# Patient Record
Sex: Female | Born: 1950 | Race: White | Hispanic: No | Marital: Married | Smoking: Never smoker
Health system: Southern US, Community
[De-identification: ages and names within clinical notes are randomized; demographics above are authoritative.]

## PROBLEM LIST (undated history)

## (undated) DIAGNOSIS — M1990 Unspecified osteoarthritis, unspecified site: Secondary | ICD-10-CM

## (undated) DIAGNOSIS — I10 Essential (primary) hypertension: Secondary | ICD-10-CM

## (undated) DIAGNOSIS — Z803 Family history of malignant neoplasm of breast: Secondary | ICD-10-CM

## (undated) DIAGNOSIS — Z923 Personal history of irradiation: Secondary | ICD-10-CM

## (undated) DIAGNOSIS — Z800 Family history of malignant neoplasm of digestive organs: Secondary | ICD-10-CM

## (undated) DIAGNOSIS — Z8042 Family history of malignant neoplasm of prostate: Secondary | ICD-10-CM

## (undated) HISTORY — DX: Family history of malignant neoplasm of digestive organs: Z80.0

## (undated) HISTORY — DX: Family history of malignant neoplasm of prostate: Z80.42

## (undated) HISTORY — DX: Essential (primary) hypertension: I10

## (undated) HISTORY — DX: Personal history of irradiation: Z92.3

## (undated) HISTORY — DX: Family history of malignant neoplasm of breast: Z80.3

---

## 1997-10-25 ENCOUNTER — Other Ambulatory Visit: Admission: RE | Admit: 1997-10-25 | Discharge: 1997-10-25 | Payer: Self-pay | Admitting: Obstetrics and Gynecology

## 1998-12-05 ENCOUNTER — Other Ambulatory Visit: Admission: RE | Admit: 1998-12-05 | Discharge: 1998-12-05 | Payer: Self-pay | Admitting: Obstetrics and Gynecology

## 1999-03-06 ENCOUNTER — Encounter: Admission: RE | Admit: 1999-03-06 | Discharge: 1999-03-06 | Payer: Self-pay | Admitting: Obstetrics and Gynecology

## 1999-03-06 ENCOUNTER — Encounter: Payer: Self-pay | Admitting: Obstetrics and Gynecology

## 1999-12-14 ENCOUNTER — Other Ambulatory Visit: Admission: RE | Admit: 1999-12-14 | Discharge: 1999-12-14 | Payer: Self-pay | Admitting: Obstetrics and Gynecology

## 2005-05-08 ENCOUNTER — Ambulatory Visit (HOSPITAL_COMMUNITY): Admission: RE | Admit: 2005-05-08 | Discharge: 2005-05-08 | Payer: Self-pay | Admitting: Family Medicine

## 2005-08-30 ENCOUNTER — Other Ambulatory Visit: Admission: RE | Admit: 2005-08-30 | Discharge: 2005-08-30 | Payer: Self-pay | Admitting: Family Medicine

## 2006-05-28 ENCOUNTER — Ambulatory Visit (HOSPITAL_COMMUNITY): Admission: RE | Admit: 2006-05-28 | Discharge: 2006-05-28 | Payer: Self-pay | Admitting: Family Medicine

## 2006-09-03 ENCOUNTER — Other Ambulatory Visit: Admission: RE | Admit: 2006-09-03 | Discharge: 2006-09-03 | Payer: Self-pay | Admitting: Family Medicine

## 2007-06-23 ENCOUNTER — Ambulatory Visit (HOSPITAL_COMMUNITY): Admission: RE | Admit: 2007-06-23 | Discharge: 2007-06-23 | Payer: Self-pay | Admitting: Family Medicine

## 2008-06-23 ENCOUNTER — Ambulatory Visit (HOSPITAL_COMMUNITY): Admission: RE | Admit: 2008-06-23 | Discharge: 2008-06-23 | Payer: Self-pay | Admitting: Family Medicine

## 2009-02-08 ENCOUNTER — Other Ambulatory Visit: Admission: RE | Admit: 2009-02-08 | Discharge: 2009-02-08 | Payer: Self-pay | Admitting: Family Medicine

## 2009-06-28 ENCOUNTER — Ambulatory Visit (HOSPITAL_COMMUNITY): Admission: RE | Admit: 2009-06-28 | Discharge: 2009-06-28 | Payer: Self-pay | Admitting: Family Medicine

## 2010-08-16 ENCOUNTER — Other Ambulatory Visit (HOSPITAL_COMMUNITY): Payer: Self-pay | Admitting: Family Medicine

## 2010-08-16 DIAGNOSIS — Z1231 Encounter for screening mammogram for malignant neoplasm of breast: Secondary | ICD-10-CM

## 2010-08-30 ENCOUNTER — Ambulatory Visit (HOSPITAL_COMMUNITY)
Admission: RE | Admit: 2010-08-30 | Discharge: 2010-08-30 | Disposition: A | Discharge status: Home / Self Care | Payer: BC Managed Care – PPO | Source: Ambulatory Visit | Attending: Family Medicine | Admitting: Family Medicine

## 2010-08-30 DIAGNOSIS — Z1231 Encounter for screening mammogram for malignant neoplasm of breast: Secondary | ICD-10-CM | POA: Insufficient documentation

## 2011-08-26 ENCOUNTER — Other Ambulatory Visit (HOSPITAL_COMMUNITY): Payer: Self-pay | Admitting: Family Medicine

## 2011-08-26 DIAGNOSIS — Z1231 Encounter for screening mammogram for malignant neoplasm of breast: Secondary | ICD-10-CM

## 2011-09-25 ENCOUNTER — Ambulatory Visit (HOSPITAL_COMMUNITY)
Admission: RE | Admit: 2011-09-25 | Discharge: 2011-09-25 | Disposition: A | Discharge status: Home / Self Care | Payer: BC Managed Care – PPO | Source: Ambulatory Visit | Attending: Family Medicine | Admitting: Family Medicine

## 2011-09-25 DIAGNOSIS — Z1231 Encounter for screening mammogram for malignant neoplasm of breast: Secondary | ICD-10-CM

## 2012-07-01 ENCOUNTER — Other Ambulatory Visit: Payer: Self-pay | Admitting: Family Medicine

## 2012-07-01 ENCOUNTER — Other Ambulatory Visit (HOSPITAL_COMMUNITY)
Admission: RE | Admit: 2012-07-01 | Discharge: 2012-07-01 | Disposition: A | Discharge status: Home / Self Care | Payer: BC Managed Care – PPO | Source: Ambulatory Visit | Attending: Family Medicine | Admitting: Family Medicine

## 2012-07-01 DIAGNOSIS — Z124 Encounter for screening for malignant neoplasm of cervix: Secondary | ICD-10-CM | POA: Insufficient documentation

## 2012-08-19 ENCOUNTER — Other Ambulatory Visit (HOSPITAL_COMMUNITY): Payer: Self-pay | Admitting: Family Medicine

## 2012-08-19 DIAGNOSIS — Z1231 Encounter for screening mammogram for malignant neoplasm of breast: Secondary | ICD-10-CM

## 2012-09-25 ENCOUNTER — Ambulatory Visit (HOSPITAL_COMMUNITY)
Admission: RE | Admit: 2012-09-25 | Discharge: 2012-09-25 | Disposition: A | Discharge status: Home / Self Care | Payer: BC Managed Care – PPO | Source: Ambulatory Visit | Attending: Family Medicine | Admitting: Family Medicine

## 2012-09-25 DIAGNOSIS — Z1231 Encounter for screening mammogram for malignant neoplasm of breast: Secondary | ICD-10-CM | POA: Insufficient documentation

## 2012-11-05 ENCOUNTER — Other Ambulatory Visit: Payer: Self-pay | Admitting: Dermatology

## 2013-11-25 ENCOUNTER — Other Ambulatory Visit (HOSPITAL_COMMUNITY): Payer: Self-pay | Admitting: Family Medicine

## 2013-11-25 DIAGNOSIS — Z1231 Encounter for screening mammogram for malignant neoplasm of breast: Secondary | ICD-10-CM

## 2013-12-14 ENCOUNTER — Ambulatory Visit (HOSPITAL_COMMUNITY): Payer: BC Managed Care – PPO

## 2014-02-09 ENCOUNTER — Other Ambulatory Visit (HOSPITAL_COMMUNITY): Payer: Self-pay | Admitting: Family Medicine

## 2014-02-09 DIAGNOSIS — Z1231 Encounter for screening mammogram for malignant neoplasm of breast: Secondary | ICD-10-CM

## 2014-02-14 ENCOUNTER — Ambulatory Visit (HOSPITAL_COMMUNITY)
Admission: RE | Admit: 2014-02-14 | Discharge: 2014-02-14 | Disposition: A | Discharge status: Home / Self Care | Payer: 59 | Source: Ambulatory Visit | Attending: Family Medicine | Admitting: Family Medicine

## 2014-02-14 DIAGNOSIS — Z1231 Encounter for screening mammogram for malignant neoplasm of breast: Secondary | ICD-10-CM | POA: Insufficient documentation

## 2014-03-04 ENCOUNTER — Other Ambulatory Visit: Payer: Self-pay | Admitting: Dermatology

## 2015-03-10 ENCOUNTER — Other Ambulatory Visit: Payer: Self-pay

## 2015-03-10 DIAGNOSIS — Z1231 Encounter for screening mammogram for malignant neoplasm of breast: Secondary | ICD-10-CM

## 2015-03-27 ENCOUNTER — Ambulatory Visit: Payer: 59

## 2015-04-10 ENCOUNTER — Ambulatory Visit: Payer: 59

## 2015-05-04 ENCOUNTER — Ambulatory Visit
Admission: RE | Admit: 2015-05-04 | Discharge: 2015-05-04 | Disposition: A | Discharge status: Home / Self Care | Payer: 59 | Source: Ambulatory Visit

## 2015-05-04 DIAGNOSIS — Z1231 Encounter for screening mammogram for malignant neoplasm of breast: Secondary | ICD-10-CM

## 2015-09-11 ENCOUNTER — Other Ambulatory Visit (HOSPITAL_COMMUNITY)
Admission: RE | Admit: 2015-09-11 | Discharge: 2015-09-11 | Disposition: A | Discharge status: Home / Self Care | Payer: 59 | Source: Ambulatory Visit | Attending: Family Medicine | Admitting: Family Medicine

## 2015-09-11 ENCOUNTER — Other Ambulatory Visit: Payer: Self-pay | Admitting: Family Medicine

## 2015-09-11 DIAGNOSIS — Z01419 Encounter for gynecological examination (general) (routine) without abnormal findings: Secondary | ICD-10-CM | POA: Diagnosis present

## 2015-09-11 DIAGNOSIS — Z1151 Encounter for screening for human papillomavirus (HPV): Secondary | ICD-10-CM | POA: Diagnosis not present

## 2015-09-13 LAB — CYTOLOGY - PAP

## 2016-03-19 DIAGNOSIS — D225 Melanocytic nevi of trunk: Secondary | ICD-10-CM | POA: Diagnosis not present

## 2016-03-19 DIAGNOSIS — D2261 Melanocytic nevi of right upper limb, including shoulder: Secondary | ICD-10-CM | POA: Diagnosis not present

## 2016-03-19 DIAGNOSIS — L821 Other seborrheic keratosis: Secondary | ICD-10-CM | POA: Diagnosis not present

## 2016-04-03 ENCOUNTER — Other Ambulatory Visit: Payer: Self-pay | Admitting: Family Medicine

## 2016-04-03 DIAGNOSIS — Z1231 Encounter for screening mammogram for malignant neoplasm of breast: Secondary | ICD-10-CM

## 2016-05-07 ENCOUNTER — Ambulatory Visit
Admission: RE | Admit: 2016-05-07 | Discharge: 2016-05-07 | Disposition: A | Discharge status: Home / Self Care | Payer: 59 | Source: Ambulatory Visit | Attending: Family Medicine | Admitting: Family Medicine

## 2016-05-07 DIAGNOSIS — Z1231 Encounter for screening mammogram for malignant neoplasm of breast: Secondary | ICD-10-CM

## 2016-09-13 DIAGNOSIS — Z23 Encounter for immunization: Secondary | ICD-10-CM | POA: Diagnosis not present

## 2016-09-13 DIAGNOSIS — Z0000 Encounter for general adult medical examination without abnormal findings: Secondary | ICD-10-CM | POA: Diagnosis not present

## 2016-09-13 DIAGNOSIS — I10 Essential (primary) hypertension: Secondary | ICD-10-CM | POA: Diagnosis not present

## 2016-09-13 DIAGNOSIS — E785 Hyperlipidemia, unspecified: Secondary | ICD-10-CM | POA: Diagnosis not present

## 2017-03-19 DIAGNOSIS — D225 Melanocytic nevi of trunk: Secondary | ICD-10-CM | POA: Diagnosis not present

## 2017-03-19 DIAGNOSIS — D1800 Hemangioma unspecified site: Secondary | ICD-10-CM | POA: Diagnosis not present

## 2017-03-19 DIAGNOSIS — D2371 Other benign neoplasm of skin of right lower limb, including hip: Secondary | ICD-10-CM | POA: Diagnosis not present

## 2017-03-19 DIAGNOSIS — D485 Neoplasm of uncertain behavior of skin: Secondary | ICD-10-CM | POA: Diagnosis not present

## 2017-03-19 DIAGNOSIS — D2262 Melanocytic nevi of left upper limb, including shoulder: Secondary | ICD-10-CM | POA: Diagnosis not present

## 2017-05-05 ENCOUNTER — Other Ambulatory Visit: Payer: Self-pay | Admitting: Family Medicine

## 2017-05-05 DIAGNOSIS — Z1231 Encounter for screening mammogram for malignant neoplasm of breast: Secondary | ICD-10-CM

## 2017-05-07 IMAGING — MG 2D DIGITAL SCREENING BILATERAL MAMMOGRAM WITH CAD AND ADJUNCT TO
8 of 13 series · 8 of 29 positions shown · non-contrast
Comparison: Previous exam(s).

ACR Breast Density Category a: The breast tissue is almost entirely
fatty.

CLINICAL DATA: Screening.

EXAM:
2D DIGITAL SCREENING BILATERAL MAMMOGRAM WITH CAD AND ADJUNCT TOMO

[L CC (1 of 2)]
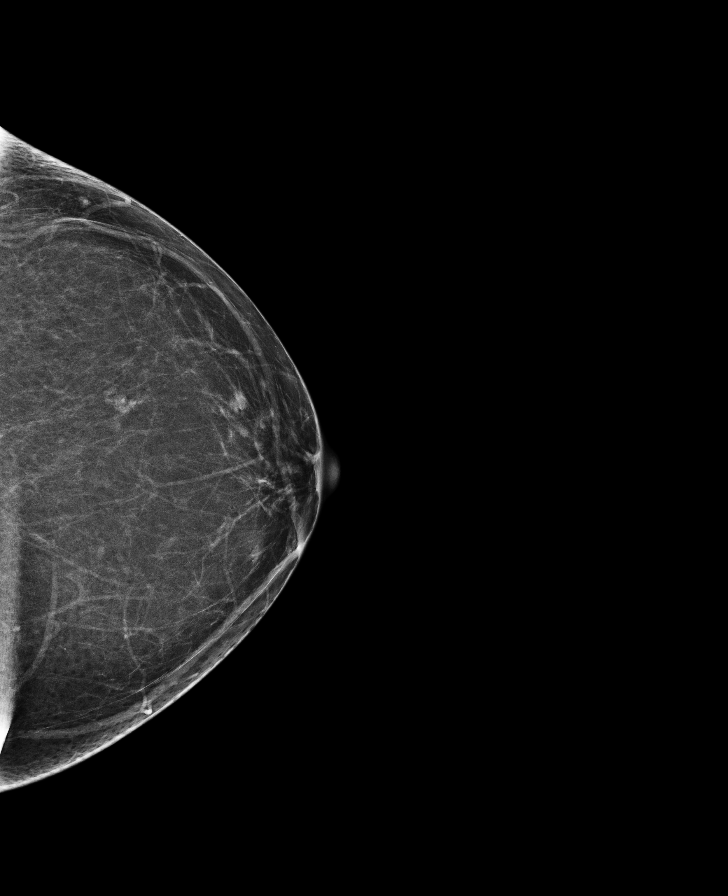

[R CC synth-2D]
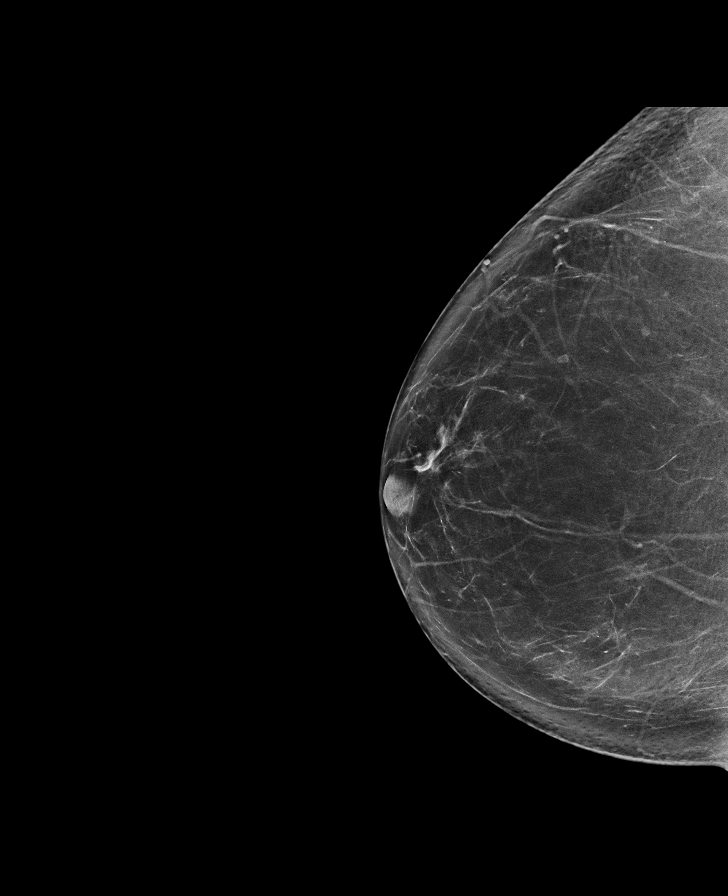

[L MLO synth-2D]
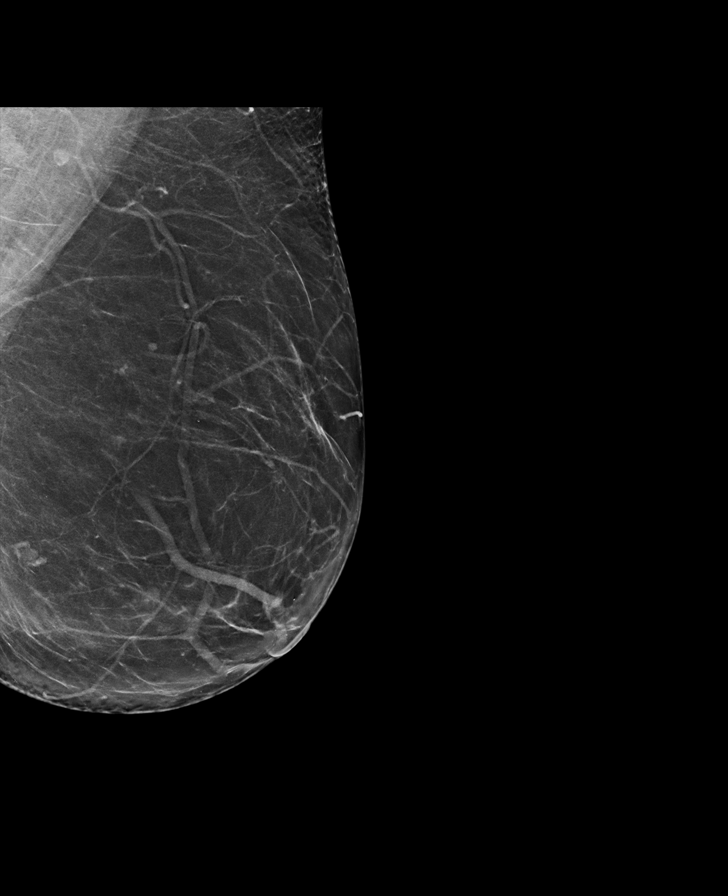

[L CC synth-2D]
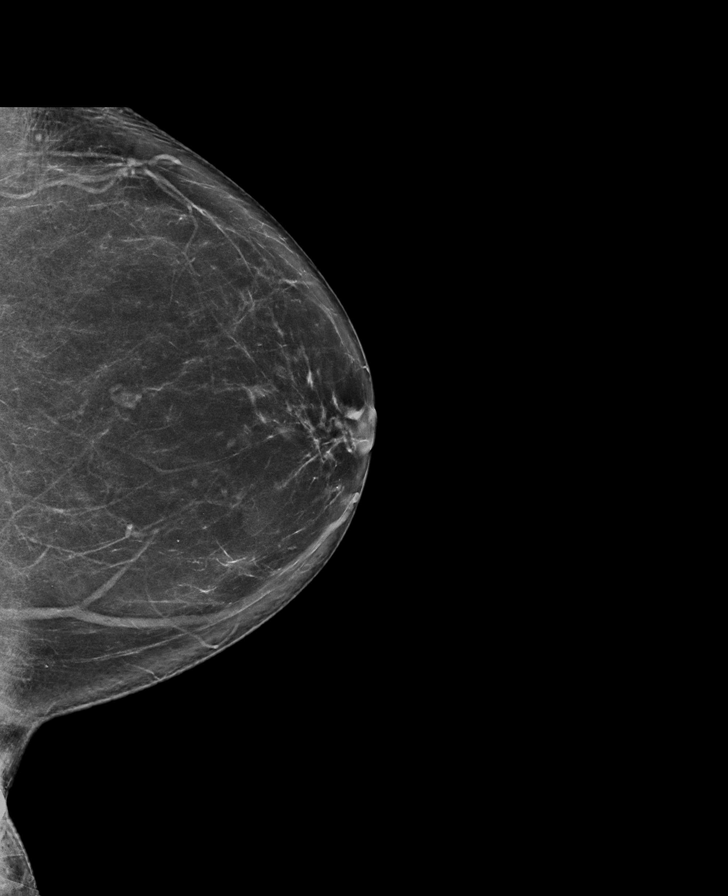

[R MLO synth-2D]
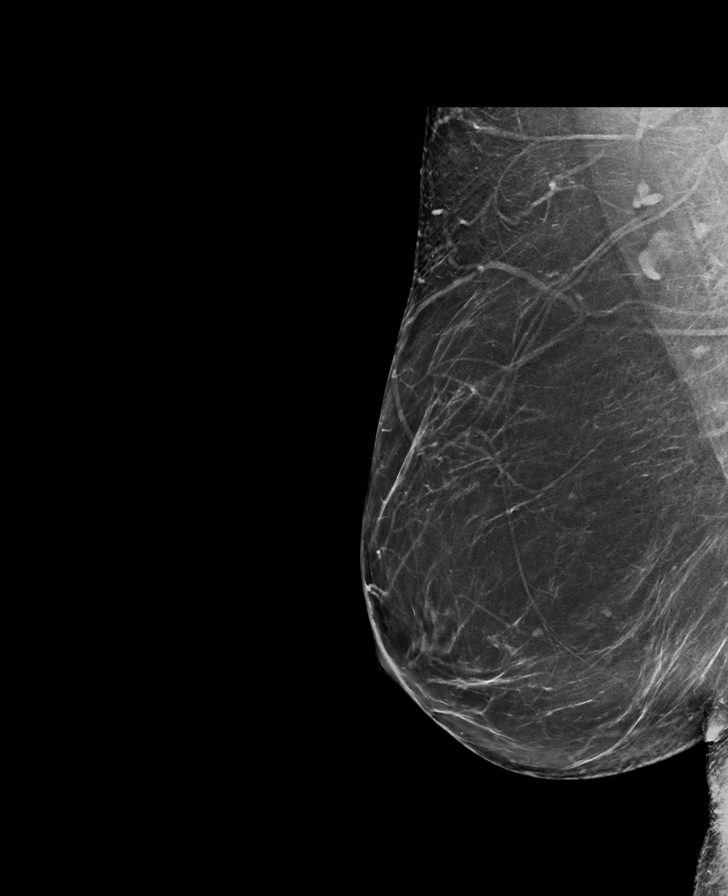

[R CC]
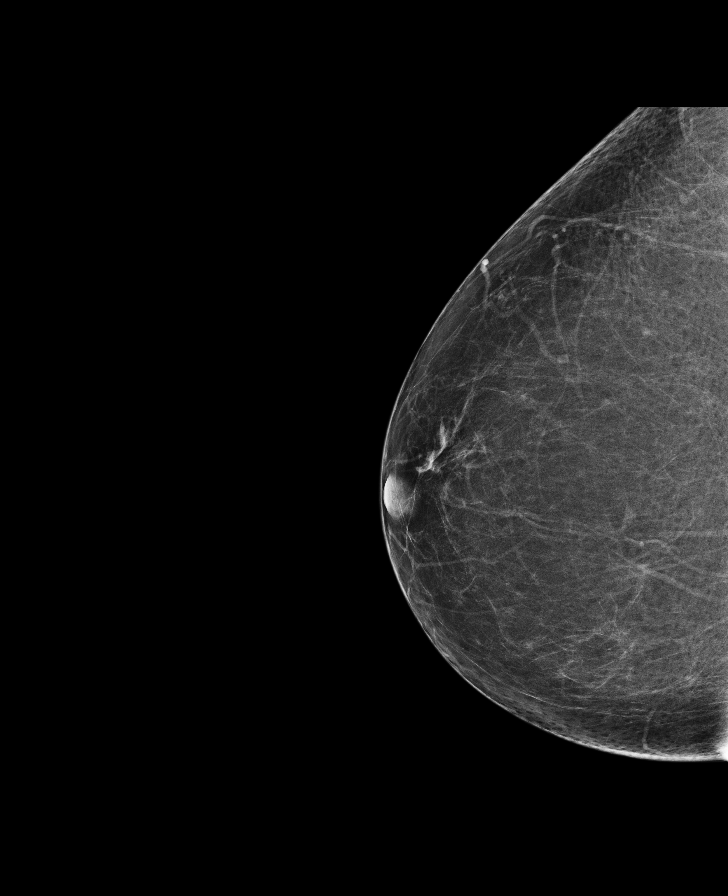

[L CC (2 of 2)]
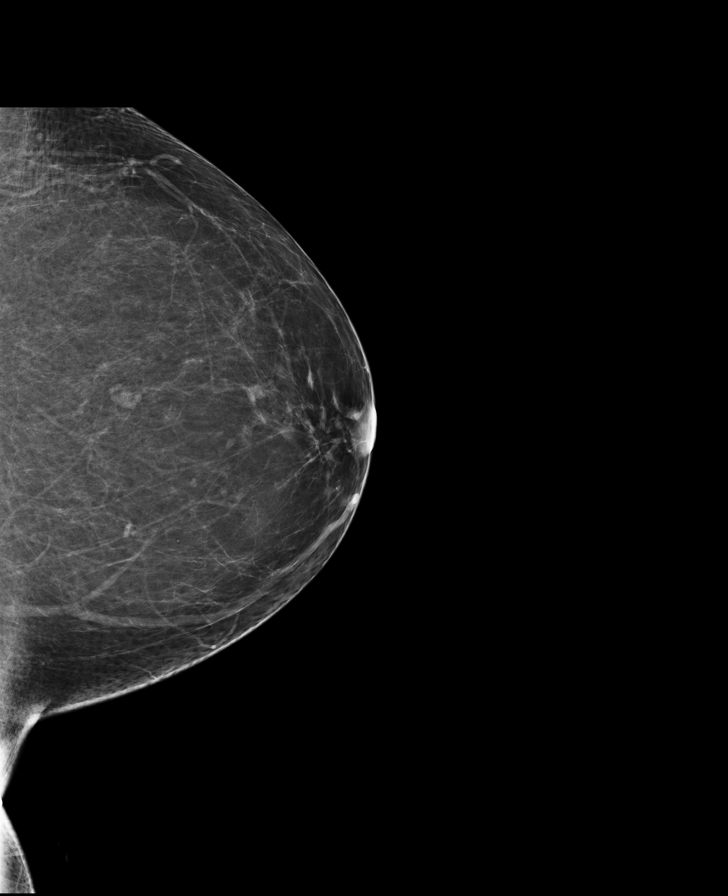

[R MLO]
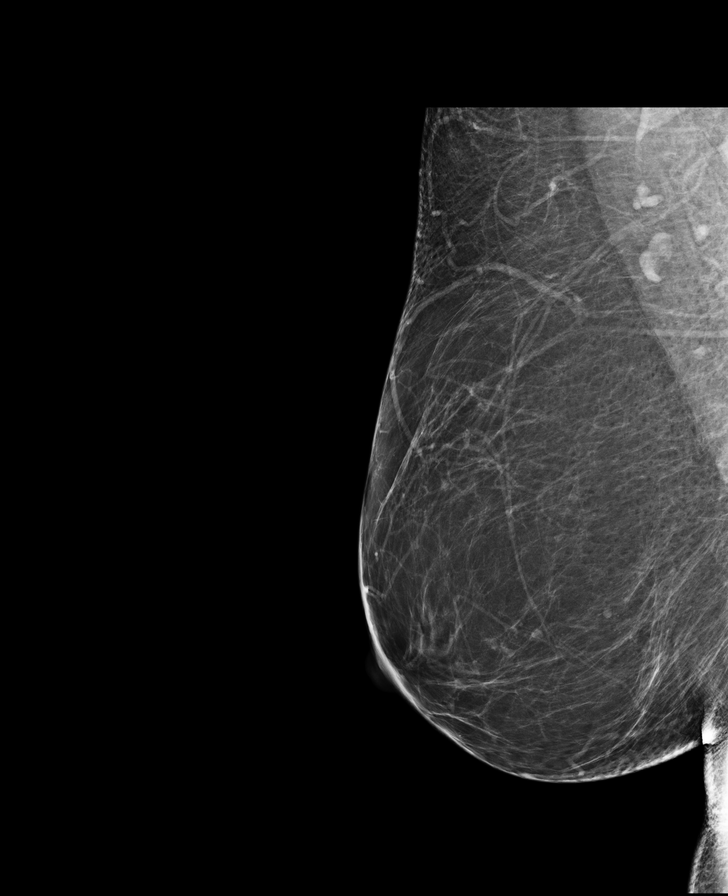

[8 of 29 positions shown; findings below may reference images not displayed]

FINDINGS: There are no findings suspicious for malignancy. Images were
processed with CAD.
IMPRESSION: No mammographic evidence of malignancy. A result letter of this
screening mammogram will be mailed directly to the patient.

RECOMMENDATION:
Screening mammogram in one year. (Code:1B-X-8P0)

BI-RADS CATEGORY  1: Negative.

## 2017-05-29 ENCOUNTER — Ambulatory Visit
Admission: RE | Admit: 2017-05-29 | Discharge: 2017-05-29 | Disposition: A | Discharge status: Home / Self Care | Payer: 59 | Source: Ambulatory Visit | Attending: Family Medicine | Admitting: Family Medicine

## 2017-05-29 DIAGNOSIS — Z1231 Encounter for screening mammogram for malignant neoplasm of breast: Secondary | ICD-10-CM

## 2017-10-01 DIAGNOSIS — Z23 Encounter for immunization: Secondary | ICD-10-CM | POA: Diagnosis not present

## 2017-10-01 DIAGNOSIS — Z0000 Encounter for general adult medical examination without abnormal findings: Secondary | ICD-10-CM | POA: Diagnosis not present

## 2017-10-01 DIAGNOSIS — I10 Essential (primary) hypertension: Secondary | ICD-10-CM | POA: Diagnosis not present

## 2017-10-01 DIAGNOSIS — E785 Hyperlipidemia, unspecified: Secondary | ICD-10-CM | POA: Diagnosis not present

## 2017-10-09 ENCOUNTER — Encounter: Payer: Self-pay | Admitting: Hematology

## 2017-10-09 ENCOUNTER — Telehealth: Payer: Self-pay | Admitting: Hematology

## 2017-10-09 NOTE — Telephone Encounter (Signed)
Dr. Justin Mend has referred the pt for the high risk breast clinic. Pt has been scheduled to see Dr. Burr Medico on 10/16 at 230pm. Pt aware to arrive 30 minutes early. Letter mailed.

## 2017-10-28 NOTE — Progress Notes (Signed)
[place]  Telephone:(336) [phone] Fax:([phone]) [place] Note   Patient Care Team: Maurice Small, MD as PCP - General (Family Medicine) 10/29/2017  Referring Physician: Dr. 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 She states that her brother had neuroendocrine tumor that metastasized to the prostate and bone. Her mother also had a neuroendocrine tumor, that she states originated from the bile ducts, and lung cancer.  Her brother underwent genetic testing, and was found to be positive for ATM mutation.  She recently tested for ATM gene mutation, which came back positive.  She has no personal history of malignancy.  Today, she is here alone at the clinic.   She has a history of HTN. She denies previous surgeries. No pregnancies. She never used hormonal replacement. She previously used OCP. Menarche age 82Y. Natural menopause age 40Y.  She denies smoking, and she drink occasionally.   MEDICAL HISTORY:  Past Medical History:  Diagnosis Date  . Hypertension     SURGICAL HISTORY: History reviewed. No pertinent surgical history.  SOCIAL HISTORY: Social History   Socioeconomic History  . Marital status: Married    Spouse name: Not on file  . Number of children: Not on file  . Years of education: Not on file  . Highest education level: Not on file  Occupational History  . Not on file  Social Needs  . Financial resource strain: Not on file  . Food insecurity:    Worry: Not on file    Inability: Not on file  . Transportation needs:    Medical: Not on file    Non-medical: Not on file  Tobacco Use  . Smoking status: Never Smoker  . Smokeless tobacco: Never Used  Substance and Sexual Activity  . Alcohol use: Not on file    Comment:  social drinker   . Drug use: Not on file  . Sexual activity: Yes  Lifestyle  . Physical activity:    Days per week: Not on file    Minutes per session: Not on file  . Stress: Not on file  Relationships  . Social connections:    Talks on phone: Not on file    Gets together: Not on file    Attends religious service: Not on file    Active member of club or organization: Not on file    Attends meetings of clubs or organizations: Not on file    Relationship status: Not on file  . Intimate partner violence:    Fear of current or ex partner: Not on file    Emotionally abused: Not on file    Physically abused: Not on file    Forced sexual activity: Not on file  Other Topics Concern  . Not on file  Social History Narrative  . Not on file    FAMILY HISTORY: Family History  Problem Relation Age of Onset  . Breast cancer Mother 20  . Cancer Mother        neuroendocrine tumor of pancrease   . Pancreatic cancer Maternal Uncle   . Pancreatic cancer Maternal Uncle   . Breast cancer Cousin 68  . Cancer Cousin        lung cancer  . Ovarian cancer Other   . Cancer Other  unknown type of cancer     ALLERGIES:  has no allergies on file.  MEDICATIONS:  Current Outpatient Medications  Medication Sig Dispense Refill  . hydrochlorothiazide (HYDRODIURIL) 25 MG tablet Take 25 mg by mouth daily.    . Multiple Vitamin (MULTIVITAMIN WITH MINERALS) TABS tablet Take 1 tablet by mouth daily.     No current facility-administered medications for this visit.     REVIEW OF SYSTEMS:   Constitutional: Denies fevers, chills or abnormal night sweats Eyes: Denies blurriness of vision, double vision or watery eyes Ears, nose, mouth, throat, and face: Denies mucositis or sore throat Respiratory: Denies cough, dyspnea or wheezes Cardiovascular: Denies palpitation, chest discomfort or lower extremity swelling Gastrointestinal:  Denies nausea, heartburn or change in bowel habits Skin: Denies  abnormal skin rashes Lymphatics: Denies new lymphadenopathy or easy bruising Neurological:Denies numbness, tingling or new weaknesses Behavioral/Psych: Mood is stable, no new changes  All other systems were reviewed with the patient and are negative.  PHYSICAL EXAMINATION: ECOG PERFORMANCE STATUS: 0 - Asymptomatic  Vitals:   10/29/17 1423 10/29/17 1431  BP: (!) 151/107 (!) 144/102  Pulse: 68   Resp: 20   Temp: 98.5 F (36.9 C)   SpO2: 96%    Filed Weights   10/29/17 1423  Weight: 196 lb 4.8 oz (89 kg)    GENERAL:alert, no distress and comfortable SKIN: skin color, texture, turgor are normal, no rashes or significant lesions LYMPH:  no palpable lymphadenopathy in the cervical, axillary or inguinal LUNGS: clear to auscultation and percussion with normal breathing effort HEART: regular rate & rhythm and no murmurs and no lower extremity edema ABDOMEN:abdomen soft, non-tender and normal bowel sounds Musculoskeletal:no cyanosis of digits and no clubbing  PSYCH: alert & oriented x 3 with fluent speech NEURO: no focal motor/sensory deficits  LABORATORY DATA:   No flowsheet data found.  06/24/2017 Genetic testing Positive result. Pathologic variant identified in ATM. Variant [id number] ([id number]) hetrozygous  RADIOGRAPHIC STUDIES: I have personally reviewed the radiological images as listed and agreed with the findings in the report. No results found.  ASSESSMENT & PLAN:  Colleen Wiley is a 67 y.o. 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  Her mother had a neuroendocrine tumor of pancreas.  Her mother and cousin had breast cancer, 2 of maternal uncle had pancreatic cancer. --We reviewed that the ATM gene  is in the pathway of DNA repair.  The pathological mutation of the ATM mutation she has predicts high risk of breast cancer, in the range of 25 to 40% lifetime.  According to the NCCN guidelines, we recommend annual mammogram and breast MRI for screening, and I recommend 6 months apart for these two screening tests.  Prophylactic double mastectomy is not recommended due to the insufficient data.  -We also discussed other risk factors for breast cancer, such as obesity, postmenopausal hormonal replacement, family history, etc.  I recommend her to have healthy diet, exercise, to reduce her risk of malignancy including breast cancer.  -We discussed chemoprevention with antiestrogen therapy to reduce risk of breast cancer.  However the data of antiestrogen therapy in genetic mutation related breast cancer is insufficient and I do not recommend.  Patient is not interested in this also.  -She is very compliant with cancer screening, she also agrees to see  her primary care physician and gynecologist to have breast exam by her physicians twice a year.  -We also discussed slightly increased risk of ovarian cancer, pancreatic cancer, prostate cancer and colon cancer from ATM mutation, however the data is insufficient, and routine screening for those cancers are not recommended.  However she has a very strong family history of pancreatic cancer, and she is very concerned.  Annal abdominal MRI for pancreatic cancer screening, along with her breast MRI, is also a reasonable option.  She is interested.  -her other sibling have also been tested, she has no children.  -I advised her to also stay up-to-date with cervical and colon cancer screening including PAP smear, colonoscopies.   2. HTN/ HLD -Her BP today is 144/102. She is on [place]. -I advised her to monitor her BP at home   PLAN -f/u with me as needed -she will continue annual 3D mammogram and breast MRI for breast cancer screening -She may consider abdominal  MRI for pancreatic cancer screening. She will f/u with Dr. Justin Mend for the above    No orders of the defined types were placed in this encounter.   All questions were answered. The patient knows to call the clinic with any problems, questions or concerns. I spent 30 minutes counseling the patient face to face. The total time spent in the appointment was 45 minutes and more than 50% was on counseling.  Dierdre Searles Dweik am acting as scribe for Dr. Truitt Merle.  I have reviewed the above documentation for accuracy and completeness, and I agree with the above.      Truitt Merle, MD 10/29/2017 3:07 PM

## 2017-10-29 ENCOUNTER — Inpatient Hospital Stay: Payer: 59 | Attending: Hematology | Admitting: Hematology

## 2017-10-29 ENCOUNTER — Encounter: Payer: Self-pay | Admitting: Hematology

## 2017-10-29 DIAGNOSIS — M8580 Other specified disorders of bone density and structure, unspecified site: Secondary | ICD-10-CM | POA: Diagnosis not present

## 2017-10-29 DIAGNOSIS — E669 Obesity, unspecified: Secondary | ICD-10-CM | POA: Insufficient documentation

## 2017-10-29 DIAGNOSIS — Z803 Family history of malignant neoplasm of breast: Secondary | ICD-10-CM | POA: Insufficient documentation

## 2017-10-29 DIAGNOSIS — Z1501 Genetic susceptibility to malignant neoplasm of breast: Secondary | ICD-10-CM | POA: Insufficient documentation

## 2017-10-29 DIAGNOSIS — E785 Hyperlipidemia, unspecified: Secondary | ICD-10-CM | POA: Insufficient documentation

## 2017-10-29 DIAGNOSIS — Z1509 Genetic susceptibility to other malignant neoplasm: Secondary | ICD-10-CM

## 2017-10-29 DIAGNOSIS — Z800 Family history of malignant neoplasm of digestive organs: Secondary | ICD-10-CM | POA: Insufficient documentation

## 2017-10-29 DIAGNOSIS — Z79899 Other long term (current) drug therapy: Secondary | ICD-10-CM | POA: Insufficient documentation

## 2017-10-29 DIAGNOSIS — I10 Essential (primary) hypertension: Secondary | ICD-10-CM | POA: Insufficient documentation

## 2017-10-29 DIAGNOSIS — Z1589 Genetic susceptibility to other disease: Secondary | ICD-10-CM | POA: Diagnosis not present

## 2017-10-30 ENCOUNTER — Telehealth: Payer: Self-pay | Admitting: Hematology

## 2017-10-30 ENCOUNTER — Encounter: Payer: Self-pay | Admitting: Hematology

## 2017-10-30 DIAGNOSIS — Z1501 Genetic susceptibility to malignant neoplasm of breast: Secondary | ICD-10-CM | POA: Insufficient documentation

## 2017-10-30 DIAGNOSIS — Z1509 Genetic susceptibility to other malignant neoplasm: Secondary | ICD-10-CM | POA: Insufficient documentation

## 2017-10-30 DIAGNOSIS — Z1589 Genetic susceptibility to other disease: Secondary | ICD-10-CM

## 2017-10-30 NOTE — Telephone Encounter (Signed)
F/u as need per 10/16 los

## 2018-03-25 DIAGNOSIS — Z23 Encounter for immunization: Secondary | ICD-10-CM | POA: Diagnosis not present

## 2018-05-20 ENCOUNTER — Other Ambulatory Visit: Payer: Self-pay | Admitting: Family Medicine

## 2018-05-20 DIAGNOSIS — Z1231 Encounter for screening mammogram for malignant neoplasm of breast: Secondary | ICD-10-CM

## 2018-07-14 ENCOUNTER — Ambulatory Visit
Admission: RE | Admit: 2018-07-14 | Discharge: 2018-07-14 | Disposition: A | Discharge status: Home / Self Care | Payer: 59 | Source: Ambulatory Visit | Attending: Family Medicine | Admitting: Family Medicine

## 2018-07-14 ENCOUNTER — Other Ambulatory Visit: Payer: Self-pay

## 2018-07-14 DIAGNOSIS — Z1231 Encounter for screening mammogram for malignant neoplasm of breast: Secondary | ICD-10-CM

## 2018-11-12 ENCOUNTER — Other Ambulatory Visit: Payer: Self-pay | Admitting: Family Medicine

## 2018-11-12 DIAGNOSIS — Z9189 Other specified personal risk factors, not elsewhere classified: Secondary | ICD-10-CM

## 2018-11-12 DIAGNOSIS — Z803 Family history of malignant neoplasm of breast: Secondary | ICD-10-CM

## 2018-11-12 DIAGNOSIS — Z1239 Encounter for other screening for malignant neoplasm of breast: Secondary | ICD-10-CM

## 2018-11-30 ENCOUNTER — Other Ambulatory Visit: Payer: 59

## 2019-02-11 ENCOUNTER — Ambulatory Visit: Payer: 59

## 2019-02-19 ENCOUNTER — Ambulatory Visit: Payer: 59 | Attending: Internal Medicine

## 2019-02-19 DIAGNOSIS — Z23 Encounter for immunization: Secondary | ICD-10-CM | POA: Insufficient documentation

## 2019-02-19 NOTE — Progress Notes (Signed)
   Covid-19 Vaccination Clinic  Name:  Colleen Wiley    MRN: [id number] DOB: 05-10-1950  02/19/2019  [name] was observed post Covid-19 immunization for 15 minutes without incidence. She was provided with Vaccine Information Sheet and instruction to access the V-Safe system.   [name] was instructed to call 911 with any severe reactions post vaccine: Marland Kitchen Difficulty breathing  . Swelling of your face and throat  . A fast heartbeat  . A bad rash all over your body  . Dizziness and weakness    Immunizations Administered    Name Date Dose VIS Date Route   Pfizer COVID-19 Vaccine 02/19/2019  3:25 PM 0.3 mL 12/25/2018 Intramuscular   Manufacturer: [place]   Lot: [id number]   [place]: [id number]

## 2019-03-04 ENCOUNTER — Ambulatory Visit: Payer: 59

## 2019-03-16 ENCOUNTER — Ambulatory Visit: Payer: 59 | Attending: Internal Medicine

## 2019-03-16 DIAGNOSIS — Z23 Encounter for immunization: Secondary | ICD-10-CM | POA: Insufficient documentation

## 2019-03-16 NOTE — Progress Notes (Signed)
   Covid-19 Vaccination Clinic  Name:  Colleen Wiley    MRN: [id number] DOB: Dec 08, 1950  03/16/2019  Colleen Wiley was observed post Covid-19 immunization for 15 minutes without incident. She was provided with Vaccine Information Sheet and instruction to access the V-Safe system.   Colleen Wiley was instructed to call 911 with any severe reactions post vaccine: Marland Kitchen Difficulty breathing  . Swelling of face and throat  . A fast heartbeat  . A bad rash all over body  . Dizziness and weakness   Immunizations Administered    Name Date Dose VIS Date Route   Pfizer COVID-19 Vaccine 03/16/2019  3:05 PM 0.3 mL 12/25/2018 Intramuscular   Manufacturer: [place]   Lot: [id number]   [place]: [id number]

## 2019-07-01 ENCOUNTER — Other Ambulatory Visit: Payer: Self-pay | Admitting: Family Medicine

## 2019-07-01 DIAGNOSIS — Z1231 Encounter for screening mammogram for malignant neoplasm of breast: Secondary | ICD-10-CM

## 2019-07-27 ENCOUNTER — Other Ambulatory Visit: Payer: Self-pay

## 2019-07-27 ENCOUNTER — Ambulatory Visit
Admission: RE | Admit: 2019-07-27 | Discharge: 2019-07-27 | Disposition: A | Discharge status: Home / Self Care | Payer: 59 | Source: Ambulatory Visit | Attending: Family Medicine | Admitting: Family Medicine

## 2019-07-27 DIAGNOSIS — Z1231 Encounter for screening mammogram for malignant neoplasm of breast: Secondary | ICD-10-CM

## 2019-10-26 ENCOUNTER — Ambulatory Visit: Payer: 59

## 2020-06-07 ENCOUNTER — Other Ambulatory Visit: Payer: Self-pay | Admitting: Family Medicine

## 2020-06-07 ENCOUNTER — Ambulatory Visit
Admission: RE | Admit: 2020-06-07 | Discharge: 2020-06-07 | Disposition: A | Discharge status: Home / Self Care | Payer: 59 | Source: Ambulatory Visit | Attending: Family Medicine | Admitting: Family Medicine

## 2020-06-07 DIAGNOSIS — R52 Pain, unspecified: Secondary | ICD-10-CM

## 2020-06-07 DIAGNOSIS — M549 Dorsalgia, unspecified: Secondary | ICD-10-CM

## 2020-07-19 ENCOUNTER — Other Ambulatory Visit: Payer: Self-pay | Admitting: Family Medicine

## 2020-07-19 DIAGNOSIS — Z1231 Encounter for screening mammogram for malignant neoplasm of breast: Secondary | ICD-10-CM

## 2020-09-12 ENCOUNTER — Ambulatory Visit
Admission: RE | Admit: 2020-09-12 | Discharge: 2020-09-12 | Disposition: A | Discharge status: Home / Self Care | Payer: 59 | Source: Ambulatory Visit | Attending: Family Medicine | Admitting: Family Medicine

## 2020-09-12 ENCOUNTER — Other Ambulatory Visit: Payer: Self-pay

## 2020-09-12 DIAGNOSIS — Z1231 Encounter for screening mammogram for malignant neoplasm of breast: Secondary | ICD-10-CM

## 2021-01-17 ENCOUNTER — Ambulatory Visit: Payer: 59 | Attending: Orthopedic Surgery | Admitting: Physical Therapy

## 2021-01-17 ENCOUNTER — Other Ambulatory Visit: Payer: Self-pay

## 2021-01-17 DIAGNOSIS — M25551 Pain in right hip: Secondary | ICD-10-CM | POA: Insufficient documentation

## 2021-01-17 DIAGNOSIS — M6281 Muscle weakness (generalized): Secondary | ICD-10-CM | POA: Diagnosis present

## 2021-01-17 DIAGNOSIS — M545 Low back pain, unspecified: Secondary | ICD-10-CM | POA: Diagnosis present

## 2021-01-17 DIAGNOSIS — G8929 Other chronic pain: Secondary | ICD-10-CM | POA: Diagnosis present

## 2021-01-17 NOTE — Therapy (Signed)
OUTPATIENT PHYSICAL THERAPY THORACOLUMBAR EVALUATION   Patient Name: Colleen Wiley MRN: [id number] DOB:12-07-1950, 71 y.o., female Today's Date: 01/17/2021   PT End of Session - 01/17/21 1410     Visit Number 1    Number of Visits 12    Date for PT Re-Evaluation 02/28/21    Authorization Type [employer]    PT Start Time 1408    PT Stop Time 1457    PT Time Calculation (min) 49 min    Activity Tolerance Patient tolerated treatment well    Behavior During Therapy WFL for tasks assessed/performed             Past Medical History:  Diagnosis Date   Hypertension    No past surgical history on file. 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  The pain can be bilateral or unilateral.  At times it has been so bad she could not walk or stand. Her pain seems muscular . She does feel better with tighter clothing.  She finds it hard to stand for home tasks, ADLs.  She can lift items but she has to be careful when she does this. She denies weakness but does have some intermittent instability as she steps the "wrong way".   Pain on R side when sleeping.   PERTINENT HISTORY:  Spondylosis, HTN, Rt hip OA, osteopenia, obesity.   PAIN:  Are you having pain? Yes, none at rest  VAS scale: 4/10 this is a good day  Pain location: bilateral low back R>L  Pain orientation: Right and  Bilateral  PAIN TYPE: aching Pain description: constant  Aggravating factors: standing still  Relieving factors: sitting. Holding and pressing down on her back in standing helps. Heat/ice  PRECAUTIONS: None  WEIGHT BEARING RESTRICTIONS No  FALLS:  Has patient fallen in last 6 months? 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  Hobbies include watching college basketball, football  PLOF: Independent  PATIENT GOALS  I want to be able to walk again without worrying.  Bend and pick up things from the floor    OBJECTIVE:   DIAGNOSTIC FINDINGS:  Moderate severe arthritis of the right hip with joint space narrowing, osteophyte and subarticular sclerosis. Mild to moderate arthritis left hip with joint space narrowing.   IMPRESSION: Right greater than left hip arthritis.      Mild dextroscoliosis. Vertebral body heights are maintained. Diffuse degenerative changes with moderate degenerative changes at L2-L3 and L3-L4. Mild facet degenerative changes of the lower lumbar spine.   IMPRESSION: Degenerative changes and mild scoliosis.  MRI not available.    PATIENT SURVEYS:  FOTO 38%  SCREENING FOR RED FLAGS: Bowel or bladder incontinence: No  COGNITION:  Overall cognitive status: Within functional limits for tasks assessed     SENSATION:  Light touch: Appears intact  Stereognosis: Appears intact  Hot/Cold: Appears intact  Proprioception: Appears intact  MUSCLE LENGTH: Hamstrings: about 90 dg each  Tight Rt IR, ER good.  L.t hip ER good, IR good POSTURE:  Sits with   Stands with lean to the L and hiked Rt trunk .Rt foot turned out.   PALPATION: Painful   LUMBARAROM/PROM  A/PROM A/PROM  01/17/2021  Flexion WFL  Extension  25%  stiff  Right lateral flexion 25% stiff on R   Left lateral flexion WNL  Right rotation WNL  Left rotation WNL    (Blank rows = not tested)  LE AROM/PROM:  A/PROM Right 01/17/2021 Left 01/17/2021  Hip flexion    Hip extension    Hip abduction    Hip adduction    Hip internal rotation    Hip external rotation    Knee flexion    Knee extension    Ankle dorsiflexion    Ankle plantarflexion    Ankle inversion    Ankle eversion     (Blank rows = not tested)  LE MMT:  MMT Right 01/17/2021 Left 01/17/2021  Hip flexion 4/5 4/5  Hip extension    Hip abduction    Hip adduction    Hip internal rotation    Hip external rotation    Knee flexion 5/5 5/5  Knee extension 5/5 5/5  Ankle dorsiflexion 5/5 5/5  Ankle plantarflexion    Ankle inversion    Ankle eversion     (Blank rows = not tested)  LUMBAR SPECIAL TESTS:  None   FUNCTIONAL TESTS:  5 times sit to stand:    GAIT: Distance walked: 150 Assistive device utilized: None Level of assistance: Modified independence Comments: slow pace, small steps, Rt foot turned out (hip ER), tight ant hips     TODAY'S TREATMENT  HEP, intro to PT, eval findings    PATIENT EDUCATION:  Education details: PT/POC, HEP Person educated: Patient Education method: Explanation Education comprehension: verbalized understanding, returned demonstration, and needs further education   HOME EXERCISE PROGRAM: Access Code: [email or web address] URL: [email or web address] Date: 01/17/2021 Prepared by: Raeford Razor  Exercises Supine Lower Trunk Rotation - 2 x daily - 7 x weekly - 2 sets - 10 reps - 10 hold Supine Hip Internal and External Rotation - 2 x daily - 7 x weekly - 2 sets - 10 reps - 10 hold Supine Bridge - 2 x daily - 7 x weekly - 2 sets - 10 reps - 5 hold  Standing Hip Flexor Stretch - 2 x daily - 7 x weekly - 1 sets - 5 reps - 30 hold   ASSESSMENT:  CLINICAL IMPRESSION: Patient is a 71 y.o. female who was seen today for  physical therapy evaluation and treatment for low back pain . Objective impairments include Abnormal gait, decreased activity tolerance, decreased mobility, difficulty walking, and decreased ROM, decreased strength. These impairments are limiting patient from cleaning, community activity, meal prep, occupation, laundry, and shopping. Personal factors including 1-2 comorbidities: Rt hip OA  are also affecting patient's functional outcome. Patient will benefit from skilled PT to address above impairments and improve overall function.  REHAB POTENTIAL: Excellent  CLINICAL DECISION MAKING: Stable/uncomplicated  EVALUATION COMPLEXITY: Low   GOALS: LONG TERM GOALS:   LTG Name Target Date Goal status  1 Pt will be I with HEP for hips, core  Baseline: 02/28/2021 INITIAL  2 FOTO score will improve to 53% or more to demo improved functional mobility  Baseline: 02/28/2021 INITIAL  3 Pt will increase Rt LE strength (hip) to 4+/5 in order to support spine in standing and walking Baseline: 02/28/21 INITIAL  4 Pt will be able to stand for basic home tasks for about 30 min with min increase in Rt LE, back pain from baseline.  Baseline: 03/01/2021 INITIAL  5 Pt will be able to cross Rt LE in sitting to don shoes without hip pain (ROM ) Baseline: 03/01/2021 INITIAL  6 Pt will be able to return to light walking 20 min a few times per week without increasing back or hip pain  Baseline: 03/01/2021 INITIAL        PLAN: PT FREQUENCY: 1-2x/week  PT DURATION: 6 weeks  PLANNED INTERVENTIONS: Therapeutic exercises, Therapeutic activity, Neuro Muscular re-education, Balance training, Gait training, Patient/Family education, Joint mobilization, Aquatic Therapy, Dry Needling, Electrical stimulation, Cryotherapy, Moist heat, and Manual therapy  PLAN FOR NEXT SESSION: check HEP, 2-6 min walk test. Nustep. HIp ROM, core    Neng Albee 01/17/2021, 3:01 PM

## 2021-01-19 ENCOUNTER — Encounter: Payer: Self-pay | Admitting: Physical Therapy

## 2021-01-19 ENCOUNTER — Other Ambulatory Visit: Payer: Self-pay

## 2021-01-19 ENCOUNTER — Ambulatory Visit: Payer: 59 | Admitting: Physical Therapy

## 2021-01-19 DIAGNOSIS — M25551 Pain in right hip: Secondary | ICD-10-CM

## 2021-01-19 DIAGNOSIS — M6281 Muscle weakness (generalized): Secondary | ICD-10-CM

## 2021-01-19 DIAGNOSIS — G8929 Other chronic pain: Secondary | ICD-10-CM

## 2021-01-19 DIAGNOSIS — M545 Low back pain, unspecified: Secondary | ICD-10-CM

## 2021-01-19 NOTE — Therapy (Signed)
OUTPATIENT PHYSICAL THERAPY TREATMENT NOTE   Patient Name: Colleen Wiley MRN: [id number] DOB:[birth year], 71 y.o., female Today's Date: 01/19/2021  PCP: Maurice Small, MD REFERRING PROVIDER: Maurice Small, MD   PT End of Session - 01/19/21 0930     Visit Number 2    Number of Visits 12    Date for PT Re-Evaluation 02/28/21    Authorization Type [employer]    PT Start Time 0930    PT Stop Time 1015    PT Time Calculation (min) 45 min             Past Medical History:  Diagnosis Date   Hypertension    History reviewed. No pertinent surgical history. 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   PRECAUTIONS: None   WEIGHT BEARING RESTRICTIONS No  SUBJECTIVE: 5/10 lower back with walking and standing. At rest, no pain at all. I did the exercises last night and this morning. They were not so bad. 1017 feet 6 min pain to 8/10  PAIN:  Are you having pain? Yes, none at rest  VAS scale: 5/10 this is a good day  Pain location: bilateral low back R>L  Pain orientation: Right and Bilateral  PAIN TYPE: aching Pain description: constant when standing or walking Aggravating factors: standing still , walking Relieving factors: sitting. Holding and pressing down on her back in standing helps. Heat/ice     OBJECTIVE:    DIAGNOSTIC FINDINGS:  Moderate severe arthritis of the right hip with joint space narrowing, osteophyte and subarticular sclerosis. Mild to moderate arthritis left hip with joint space narrowing.   IMPRESSION: Right greater than left hip arthritis.       Mild dextroscoliosis. Vertebral body heights are  maintained. Diffuse degenerative changes with moderate degenerative changes at L2-L3 and L3-L4. Mild facet degenerative changes of the lower lumbar spine.   IMPRESSION: Degenerative changes and mild scoliosis.   MRI not available.     PATIENT SURVEYS:  FOTO 38%   SCREENING FOR RED FLAGS: Bowel or bladder incontinence: No   COGNITION:          Overall cognitive status: Within functional limits for tasks assessed                        SENSATION:          Light touch: Appears intact          Stereognosis: Appears intact          Hot/Cold: Appears intact          Proprioception: Appears intact   MUSCLE LENGTH: Hamstrings: about 90 dg each  Tight Rt IR, ER good.  L.t hip ER good, IR good POSTURE:  Sits with    Stands with lean to the L and hiked Rt trunk .Rt foot turned out.    PALPATION: Painful    LUMBARAROM/PROM   A/PROM A/PROM  01/17/2021  Flexion WFL  Extension  25% stiff  Right lateral flexion 25% stiff on R   Left lateral flexion WNL  Right rotation WNL  Left rotation WNL    (Blank rows = not tested)  LE AROM/PROM:   A/PROM Right 01/17/2021 Left 01/17/2021  Hip flexion      Hip extension      Hip abduction      Hip adduction      Hip internal rotation      Hip external rotation      Knee flexion      Knee extension      Ankle dorsiflexion      Ankle plantarflexion      Ankle inversion      Ankle eversion       (Blank rows = not tested)   LE MMT:   MMT Right 01/17/2021 Left 01/17/2021  Hip flexion 4/5 4/5  Hip extension      Hip abduction      Hip adduction      Hip internal rotation      Hip external rotation      Knee flexion 5/5 5/5  Knee extension 5/5 5/5  Ankle dorsiflexion 5/5 5/5  Ankle plantarflexion      Ankle inversion      Ankle eversion       (Blank rows = not tested)   LUMBAR SPECIAL TESTS:  None    FUNCTIONAL TESTS:  5 times sit to stand:  not assessed 6 MWT: 1017 feet (pain 8/10) Date: 01/19/21   GAIT: Distance walked:  150 Assistive device utilized: None Level of assistance: Modified independence Comments: slow pace, small steps, Rt foot turned out (hip ER), tight ant hips    TODAY'S TREATMENT 01/19/21  -6 min walk test 1017 feet with pain up to 8/10 in low back   -Single knee to chest  -LTR  -LTR feet wide  -bridge 10 x 2  -Pelvic tilt 10 x 2 with mod cues for technique and breathing  -PPT with bent knee fall outs- cues for technique and breathing  -Nustep L3 UE/LE x 5 minutes   -Standing hip flexor stretch 30 sec x 2 each    Initial TREATMENT  HEP, intro to PT, eval findings      PATIENT EDUCATION:  Education details: updated HEP Person educated: Patient Education method: Explanation Education comprehension: verbalized understanding, returned demonstration, and needs further education     HOME EXERCISE PROGRAM: Access Code: [email or web address] URL: [email or web address] Date: 01/19/2021 Prepared by: Hessie Diener  Exercises Supine Lower Trunk Rotation - 2 x daily - 7 x weekly - 2 sets - 10 reps - 10 hold Supine Hip Internal and External Rotation - 2 x daily - 7 x weekly - 2 sets - 10 reps - 10 hold Supine Bridge - 2 x daily - 7 x weekly - 2 sets - 10 reps - 5 hold Standing Hip Flexor Stretch - 2 x daily - 7 x weekly - 1 sets - 5 reps - 30 hold Pelvic tilt - 1 x daily - 7 x weekly - 2 sets - 10 reps - 5 hold Bent Knee Fallouts - 1 x daily - 7 x weekly - 2 sets - 10 reps      ASSESSMENT:   CLINICAL IMPRESSION: Patient reports compliance with HEP. Reviewed HEP and captured 6MWT (1017 feet) for baseline. Progressive increased in pain to 8/10 with 6MWT which was decreased after sitting. Reviewed HEP and began Nustep. Initiated core stabilization and updated her HEP. She required mod cues for technique and breathing during core stabilization.   REHAB POTENTIAL: Excellent   CLINICAL DECISION MAKING: Stable/uncomplicated   EVALUATION COMPLEXITY: Low     GOALS: LONG  TERM GOALS:     LTG Name Target Date Goal status  1 Pt will be I with HEP for hips, core  Baseline: 02/28/2021 INITIAL  2 FOTO score will improve to 53% or more to demo improved functional mobility  Baseline: 02/28/2021 INITIAL  3 Pt will increase Rt LE strength (hip) to 4+/5 in order to support spine in standing and walking Baseline: 02/28/21 INITIAL  4 Pt will be able to stand for basic home tasks for about 30 min with min increase in Rt LE, back pain from baseline.  Baseline: 03/01/2021 INITIAL  5 Pt will be able to cross Rt LE in sitting to don shoes without hip pain (ROM ) Baseline: 03/01/2021 INITIAL  6 Pt will be able to return to light walking 20 min a few times per week without increasing back or hip pain  Baseline: 03/01/2021 INITIAL             PLAN: PT FREQUENCY: 1-2x/week   PT DURATION: 6 weeks   PLANNED INTERVENTIONS: Therapeutic exercises, Therapeutic activity, Neuro Muscular re-education, Balance training, Gait training, Patient/Family education, Joint mobilization, Aquatic Therapy, Dry Needling, Electrical stimulation, Cryotherapy, Moist heat, and Manual therapy   PLAN FOR NEXT SESSION: check HEP, Nustep. HIp ROM, core , make 6MWT goal, capture 5 x STS      Hessie Diener, PTA 01/19/21 10:25 AM Phone: [phone] Fax: [phone]

## 2021-01-22 ENCOUNTER — Encounter: Payer: Self-pay | Admitting: Physical Therapy

## 2021-01-22 ENCOUNTER — Ambulatory Visit: Payer: 59 | Admitting: Physical Therapy

## 2021-01-22 ENCOUNTER — Other Ambulatory Visit: Payer: Self-pay

## 2021-01-22 DIAGNOSIS — M545 Low back pain, unspecified: Secondary | ICD-10-CM

## 2021-01-22 DIAGNOSIS — M6281 Muscle weakness (generalized): Secondary | ICD-10-CM | POA: Diagnosis not present

## 2021-01-22 DIAGNOSIS — M25551 Pain in right hip: Secondary | ICD-10-CM

## 2021-01-22 NOTE — Therapy (Signed)
OUTPATIENT PHYSICAL THERAPY TREATMENT NOTE   Patient Name: Colleen Wiley MRN: [id number] DOB:06-02-1950, 71 y.o., female Today's Date: 01/22/2021  PCP: Maurice Small, MD REFERRING PROVIDER: Phylliss Bob, MD   PT End of Session - 01/22/21 1148     Visit Number 3    Number of Visits 12    Date for PT Re-Evaluation 02/28/21    Authorization Type [employer]    PT Start Time 4481    PT Stop Time 1227    PT Time Calculation (min) 42 min             Past Medical History:  Diagnosis Date   Hypertension    History reviewed. No pertinent surgical history. 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   PRECAUTIONS: None   WEIGHT BEARING RESTRICTIONS No  SUBJECTIVE: 4/10 lower back with walking and standing. At rest, no pain at all. I was out a lot this weekend and I was able to stand longer without as severe of pain. I feel good after the exercises but I get stiff again.   PAIN:  Are you having pain? Ye VAS scale: 4/10  Pain location: bilateral low back R>L  Pain orientation: Right and Bilateral  PAIN TYPE: stiff Pain description: constant when standing or walking Aggravating factors: standing still , walking Relieving factors: sitting. Holding and pressing down on her back in standing helps. Heat/ice     OBJECTIVE:    DIAGNOSTIC FINDINGS:  Moderate severe arthritis of the right hip with joint space narrowing, osteophyte and subarticular sclerosis. Mild to moderate arthritis left hip with joint space narrowing.   IMPRESSION: Right greater than left hip arthritis.       Mild dextroscoliosis. Vertebral body heights  are maintained. Diffuse degenerative changes with moderate degenerative changes at L2-L3 and L3-L4. Mild facet degenerative changes of the lower lumbar spine.   IMPRESSION: Degenerative changes and mild scoliosis.   MRI not available.     PATIENT SURVEYS:  FOTO 38%   SCREENING FOR RED FLAGS: Bowel or bladder incontinence: No   COGNITION:          Overall cognitive status: Within functional limits for tasks assessed                        SENSATION:          Light touch: Appears intact          Stereognosis: Appears intact          Hot/Cold: Appears intact          Proprioception: Appears intact   MUSCLE LENGTH: Hamstrings: about 90 dg each  Tight Rt IR, ER good.  L.t hip ER good, IR good POSTURE:  Sits with    Stands with lean to the L and hiked Rt trunk .Rt foot turned out.    PALPATION: Painful    LUMBARAROM/PROM   A/PROM A/PROM  01/17/2021  Flexion WFL  Extension  25% stiff  Right lateral flexion 25% stiff on R   Left lateral flexion WNL  Right rotation WNL  Left rotation WNL    (Blank rows = not tested)  LE AROM/PROM:   A/PROM Right 01/17/2021 Left 01/17/2021  Hip flexion      Hip extension      Hip abduction      Hip adduction      Hip internal rotation      Hip external rotation      Knee flexion      Knee extension      Ankle dorsiflexion      Ankle plantarflexion      Ankle inversion      Ankle eversion       (Blank rows = not tested)   LE MMT:   MMT Right 01/17/2021 Left 01/17/2021  Hip flexion 4/5 4/5  Hip extension      Hip abduction      Hip adduction      Hip internal rotation      Hip external rotation      Knee flexion 5/5 5/5  Knee extension 5/5 5/5  Ankle dorsiflexion 5/5 5/5  Ankle plantarflexion      Ankle inversion      Ankle eversion       (Blank rows = not tested)   LUMBAR SPECIAL TESTS:  None    FUNCTIONAL TESTS:  5 times sit to stand:  9.2 sec Date 01/22/21 6 MWT: 1017 feet (pain 8/10) Date: 01/19/21    GAIT: Distance walked: 150 Assistive device utilized: None Level of assistance: Modified independence Comments: slow pace, small steps, Rt foot turned out (hip ER), tight ant hips    TODAY'S TREATMENT 01/22/21  Therapeutic Exercise:  -Nustep L4 UE/LE x 5 minutes  -Standing hip abduction 10 x 2 each  -bilat heel raises x10   -Alternating march 10 x 2   -Standing hip flexor stretch 30 sec x 2 each    -STS x 5  (9.2 sec) without UE from Mat   -Figure 4 stretch in sitting 30 sec x 2 each -more difficulty L>R  -Single knee to chest x 3 each  -LTR  -bridge 10 x 2  -LTR feet wide  -Pelvic tilt 10 x 2 with mod cues for technique and breathing  -PPT with bent knee fall outs- cues for technique and breathing  -PPT with alternating bent knee fall outs- cues for slow movements and maintaining PPT   - PPT with alternating march- cues for technique and breathing     TREATMENT 01/19/21  -6 min walk test 1017 feet with pain up to 8/10 in low back   -Single knee to chest  -LTR  -LTR feet wide  -bridge 10 x 2  -Pelvic tilt 10 x 2 with mod cues for technique and breathing  -PPT with bent knee fall outs- cues for technique and breathing  -Nustep L3 UE/LE x 5 minutes   -Standing hip flexor stretch 30 sec x 2 each    Initial TREATMENT  HEP, intro to PT, eval findings      PATIENT EDUCATION:  Education details: updated HEP Person educated: Patient Education method: Explanation Education comprehension: verbalized understanding, returned demonstration, and needs further education     HOME EXERCISE PROGRAM: Access Code: [email or web address] URL: [email or web address] Date: 01/22/2021 Prepared by: Hessie Diener  Exercises Supine Lower Trunk Rotation - 2 x daily - 7 x weekly - 2 sets - 10 reps - 10 hold Supine Hip Internal and External Rotation - 2 x daily - 7 x weekly - 2 sets - 10 reps - 10 hold Supine Bridge - 2 x daily - 7 x weekly -  2 sets - 10 reps - 5 hold Standing Hip Flexor  Stretch - 2 x daily - 7 x weekly - 1 sets - 5 reps - 30 hold Pelvic tilt - 1 x daily - 7 x weekly - 2 sets - 10 reps - 5 hold Bent Knee Fallouts - 1 x daily - 7 x weekly - 2 sets - 10 reps Standing Hip Abduction with Counter Support - 1 x daily - 7 x weekly - 2 sets - 10 reps - 2-3 hold Standing March with Counter Support - 1 x daily - 7 x weekly - 2 sets - 10 reps - 2-3 hold       ASSESSMENT:   CLINICAL IMPRESSION: Patient reports compliance with HEP. She reports increased standing time over the weekend.  5X STS captured at 9.2 sec from mat table.   Progressed therex with standing hip AROM which she reported stiffness moving left LE. Reviewed core stabilization and progressed with good tolerance. REviewed HEP stretches. At end of session she reported feeling good and more flexible.   REHAB POTENTIAL: Excellent   CLINICAL DECISION MAKING: Stable/uncomplicated   EVALUATION COMPLEXITY: Low     GOALS: LONG TERM GOALS:    LTG Name Target Date Goal status  1 Pt will be I with HEP for hips, core  Baseline: 02/28/2021 INITIAL  2 FOTO score will improve to 53% or more to demo improved functional mobility  Baseline: 02/28/2021 INITIAL  3 Pt will increase Rt LE strength (hip) to 4+/5 in order to support spine in standing and walking Baseline: 02/28/21 INITIAL  4 Pt will be able to stand for basic home tasks for about 30 min with min increase in Rt LE, back pain from baseline.  Baseline: 03/01/2021 INITIAL  5 Pt will be able to cross Rt LE in sitting to don shoes without hip pain (ROM ) Baseline: 03/01/2021 INITIAL  6 Pt will be able to return to light walking 20 min a few times per week without increasing back or hip pain  Baseline: 03/01/2021 INITIAL             PLAN: PT FREQUENCY: 1-2x/week   PT DURATION: 6 weeks   PLANNED INTERVENTIONS: Therapeutic exercises, Therapeutic activity, Neuro Muscular re-education, Balance training, Gait training, Patient/Family education, Joint  mobilization, Aquatic Therapy, Dry Needling, Electrical stimulation, Cryotherapy, Moist heat, and Manual therapy   PLAN FOR NEXT SESSION: check HEP additions, Nustep. HIp ROM, core , make 6MWT goal and 5 X STS goals ( see objective measures)      Hessie Diener, PTA 01/22/21 12:47 PM Phone: [phone] Fax: [phone]

## 2021-01-24 ENCOUNTER — Ambulatory Visit: Payer: 59 | Admitting: Physical Therapy

## 2021-01-26 ENCOUNTER — Ambulatory Visit: Payer: 59 | Admitting: Physical Therapy

## 2021-01-26 ENCOUNTER — Other Ambulatory Visit: Payer: Self-pay

## 2021-01-26 ENCOUNTER — Encounter: Payer: Self-pay | Admitting: Physical Therapy

## 2021-01-26 DIAGNOSIS — M25551 Pain in right hip: Secondary | ICD-10-CM

## 2021-01-26 DIAGNOSIS — M6281 Muscle weakness (generalized): Secondary | ICD-10-CM | POA: Diagnosis not present

## 2021-01-26 DIAGNOSIS — G8929 Other chronic pain: Secondary | ICD-10-CM

## 2021-01-26 NOTE — Therapy (Signed)
OUTPATIENT PHYSICAL THERAPY TREATMENT NOTE   Patient Name: Colleen Wiley MRN: [id number] DOB:11-Mar-1950, 71 y.o., female Today's Date: 01/26/2021  PCP: Maurice Small, MD REFERRING PROVIDER: Phylliss Bob, MD   PT End of Session - 01/26/21 1148     Visit Number 4    Number of Visits 12    Date for PT Re-Evaluation 02/28/21    Authorization Type [employer]    PT Start Time 2505    PT Stop Time 1227    PT Time Calculation (min) 42 min             Past Medical History:  Diagnosis Date   Hypertension    History reviewed. No pertinent surgical history. 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   PRECAUTIONS: None   WEIGHT BEARING RESTRICTIONS No  SUBJECTIVE: Just a little bit of pain on right side of lower back. 3/10 lower back with walking and standing. At rest, no pain at all. I was out a lot this weekend and I was able to keep up shopping with friends.   PAIN:  Are you having pain? Yes VAS scale: 3/10  Pain location: bilateral low back R>L  Pain orientation: Right and Bilateral  PAIN TYPE: stiff Pain description: constant when standing or walking Aggravating factors: standing still , walking Relieving factors: sitting. Holding and pressing down on her back in standing helps. Heat/ice     OBJECTIVE:    DIAGNOSTIC FINDINGS:  Moderate severe arthritis of the right hip with joint space narrowing, osteophyte and subarticular sclerosis. Mild to moderate arthritis left hip with joint space narrowing.   IMPRESSION: Right greater than left hip arthritis.       Mild dextroscoliosis. Vertebral body heights are  maintained. Diffuse degenerative changes with moderate degenerative changes at L2-L3 and L3-L4. Mild facet degenerative changes of the lower lumbar spine.   IMPRESSION: Degenerative changes and mild scoliosis.   MRI not available.     PATIENT SURVEYS:  FOTO 38%   SCREENING FOR RED FLAGS: Bowel or bladder incontinence: No   COGNITION:          Overall cognitive status: Within functional limits for tasks assessed                        SENSATION:          Light touch: Appears intact          Stereognosis: Appears intact          Hot/Cold: Appears intact          Proprioception: Appears intact   MUSCLE LENGTH: Hamstrings: about 90 dg each  Tight Rt IR, ER good.  L.t hip ER good, IR good POSTURE:  Sits with    Stands with lean to the L and hiked Rt trunk .Rt foot turned out.    PALPATION: Painful    LUMBARAROM/PROM   A/PROM A/PROM  01/17/2021  Flexion WFL  Extension  25% stiff  Right lateral flexion 25% stiff on R   Left lateral flexion WNL  Right rotation WNL  Left rotation WNL    (Blank rows = not tested)  LE AROM/PROM:   A/PROM Right 01/17/2021 Left 01/17/2021  Hip flexion      Hip extension      Hip abduction      Hip adduction      Hip internal rotation      Hip external rotation      Knee flexion      Knee extension      Ankle dorsiflexion      Ankle plantarflexion      Ankle inversion      Ankle eversion       (Blank rows = not tested)   LE MMT:   MMT Right 01/17/2021 Left 01/17/2021  Hip flexion 4/5 4/5  Hip extension      Hip abduction      Hip adduction      Hip internal rotation      Hip external rotation      Knee flexion 5/5 5/5  Knee extension 5/5 5/5  Ankle dorsiflexion 5/5 5/5  Ankle plantarflexion      Ankle inversion      Ankle eversion       (Blank rows = not tested)   LUMBAR SPECIAL TESTS:  None    FUNCTIONAL TESTS:  5 times sit to stand:  9.2 sec Date 01/22/21 6 MWT: 1017 feet (pain 8/10) Date: 01/19/21   GAIT: Distance  walked: 150 Assistive device utilized: None Level of assistance: Modified independence Comments: slow pace, small steps, Rt foot turned out (hip ER), tight ant hips    TODAY'S TREATMENT 01/26/21             Therapeutic Exercise:             -Nustep L5 UE/LE x 5 minutes             -Standing hip abduction 10 x 2 each  -4inch step down x 10 each              -bilat heel raises x10              -Alternating march 10 x 2              -Standing hip flexor stretch 30 sec x 2 each               -STS x 5  (9.2 sec) without UE from Mat              -Figure 4 stretch in sitting 30 sec x 2 each -more difficulty L>R             -Single knee to chest x 3 each             -LTR             -bridge 10   -clam with green band supine x 20             -LTR feet wide             -Pelvic tilt 10 x 1 with mod cues for technique and breathing             - PPT with alternating march x10- cues for technique and breathing   TREATMENT 01/22/21  Therapeutic Exercise:  -Nustep L4 UE/LE x 5 minutes  -Standing hip abduction 10 x 2 each  -bilat heel raises x 20  -Alternating march 10 x 2   -Standing hip flexor stretch 30 sec x 2 each    -STS  x 10  -  Figure 4 stretch in sitting 30 sec x 2 each -more difficulty L>R  -Single knee to chest x 3 each  -LTR  -bridge 10 x 2  -LTR feet wide  -Pelvic tilt 10 x 2 with mod cues for technique and breathing  -PPT with bent knee fall outs- cues for technique and breathing  -PPT with alternating bent knee fall outs- cues for slow movements and maintaining PPT   - PPT with alternating march- cues for technique and breathing     TREATMENT 01/19/21  -6 min walk test 1017 feet with pain up to 8/10 in low back   -Single knee to chest  -LTR  -LTR feet wide  -bridge 10 x 2  -Pelvic tilt 10 x 2 with mod cues for technique and breathing  -PPT with bent knee fall outs- cues for technique and breathing  -Nustep L3 UE/LE x 5 minutes   -Standing hip flexor stretch 30 sec x 2 each         PATIENT EDUCATION:  Education details: continue HEP Person educated: Patient Education method: Explanation Education comprehension: verbalized understanding, returned demonstration, and needs further education     HOME EXERCISE PROGRAM: Access Code: [email or web address] URL: [email or web address] Date: 01/22/2021 Prepared by: Hessie Diener  Exercises Supine Lower Trunk Rotation - 2 x daily - 7 x weekly - 2 sets - 10 reps - 10 hold Supine Hip Internal and External Rotation - 2 x daily - 7 x weekly - 2 sets - 10 reps - 10 hold Supine Bridge - 2 x daily - 7 x weekly - 2 sets - 10 reps - 5 hold Standing Hip Flexor Stretch - 2 x daily - 7 x weekly - 1 sets - 5 reps - 30 hold Pelvic tilt - 1 x daily - 7 x weekly - 2 sets - 10 reps - 5 hold Bent Knee Fallouts - 1 x daily - 7 x weekly - 2 sets - 10 reps Standing Hip Abduction with Counter Support - 1 x daily - 7 x weekly - 2 sets - 10 reps - 2-3 hold Standing March with Counter Support - 1 x daily - 7 x weekly - 2 sets - 10 reps - 2-3 hold       ASSESSMENT:   CLINICAL IMPRESSION: Patient reports compliance with HEP. She reports continued improvement in standing and walking tolerance.  Continued per PT POC. Began gait cues for increased step length and hip flexion.   REHAB POTENTIAL: Excellent   CLINICAL DECISION MAKING: Stable/uncomplicated   EVALUATION COMPLEXITY: Low     GOALS: LONG TERM GOALS:    LTG Name Target Date Goal status  1 Pt will be I with HEP for hips, core  Baseline: 02/28/2021 INITIAL  2 FOTO score will improve to 53% or more to demo improved functional mobility  Baseline: 02/28/2021 INITIAL  3 Pt will increase Rt LE strength (hip) to 4+/5 in order to support spine in standing and walking Baseline: 02/28/21 INITIAL  4 Pt will be able to stand for basic home tasks for about 30 min with min increase in Rt LE, back pain from baseline.  Baseline: 03/01/2021 INITIAL  5 Pt will be able to cross Rt LE in  sitting to don shoes without hip pain (ROM ) Baseline: 03/01/2021 INITIAL  6 Pt will be able to return to light walking 20 min a few times per week without increasing back or hip pain  Baseline: 03/01/2021 INITIAL  PLAN: PT FREQUENCY: 1-2x/week   PT DURATION: 6 weeks   PLANNED INTERVENTIONS: Therapeutic exercises, Therapeutic activity, Neuro Muscular re-education, Balance training, Gait training, Patient/Family education, Joint mobilization, Aquatic Therapy, Dry Needling, Electrical stimulation, Cryotherapy, Moist heat, and Manual therapy   PLAN FOR NEXT SESSION: check HEP additions, Nustep. HIp ROM, core , make 6MWT goal and 5 X STS goals ( see objective measures)      Hessie Diener, PTA 01/26/21 11:49 AM Phone: [phone] Fax: [phone]

## 2021-01-29 ENCOUNTER — Ambulatory Visit: Payer: 59 | Admitting: Physical Therapy

## 2021-01-29 ENCOUNTER — Other Ambulatory Visit: Payer: Self-pay

## 2021-01-29 ENCOUNTER — Encounter: Payer: Self-pay | Admitting: Physical Therapy

## 2021-01-29 DIAGNOSIS — M25551 Pain in right hip: Secondary | ICD-10-CM

## 2021-01-29 DIAGNOSIS — M6281 Muscle weakness (generalized): Secondary | ICD-10-CM | POA: Diagnosis not present

## 2021-01-29 DIAGNOSIS — M545 Low back pain, unspecified: Secondary | ICD-10-CM

## 2021-01-29 DIAGNOSIS — G8929 Other chronic pain: Secondary | ICD-10-CM

## 2021-01-29 NOTE — Therapy (Signed)
OUTPATIENT PHYSICAL THERAPY TREATMENT NOTE   Patient Name: Colleen Wiley MRN: [id number] DOB:Mar 10, 1950, 71 y.o., female Today's Date: 01/29/2021  PCP: Maurice Small, MD REFERRING PROVIDER: Phylliss Bob, MD   PT End of Session - 01/29/21 1508     Visit Number 5    Number of Visits 12    Date for PT Re-Evaluation 02/28/21    Authorization Type [employer]    PT Start Time 1500    PT Stop Time 8841    PT Time Calculation (min) 48 min    Activity Tolerance Patient tolerated treatment well    Behavior During Therapy [facility] for tasks assessed/performed              Past Medical History:  Diagnosis Date   Hypertension    History reviewed. No pertinent surgical history. 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   PRECAUTIONS: None   WEIGHT BEARING RESTRICTIONS No  SUBJECTIVE: Really stiff today, has been sitting a lot. Does her exercises everyday!  PAIN:  Are you having pain? Yes VAS scale: rated moderate and general back and hips   Pain location: bilateral low back R>L  Pain orientation: Bilateral  PAIN TYPE: stiff Pain description: constant when standing or walking Aggravating factors: standing still , walking Relieving factors: sitting. Holding and pressing down on her back in standing helps. Heat/ice     OBJECTIVE:    DIAGNOSTIC FINDINGS:  Moderate severe arthritis of the right hip with joint space narrowing, osteophyte and subarticular sclerosis. Mild to moderate arthritis left hip with joint space narrowing.   IMPRESSION: Right greater than left hip arthritis.       Mild dextroscoliosis. Vertebral body  heights are maintained. Diffuse degenerative changes with moderate degenerative changes at L2-L3 and L3-L4. Mild facet degenerative changes of the lower lumbar spine.   IMPRESSION: Degenerative changes and mild scoliosis.   MRI not available.     PATIENT SURVEYS:  FOTO 38%   SCREENING FOR RED FLAGS: Bowel or bladder incontinence: No   COGNITION:          Overall cognitive status: Within functional limits for tasks assessed                        SENSATION:          Light touch: Appears intact          Stereognosis: Appears intact          Hot/Cold: Appears intact          Proprioception: Appears intact   MUSCLE LENGTH: Hamstrings: about 90 dg each  Tight Rt IR, ER good.  L.t hip ER good, IR good POSTURE:  Sits with    Stands with lean to the L and hiked Rt trunk .Rt foot turned out.    PALPATION: Painful    LUMBARAROM/PROM   A/PROM A/PROM  01/17/2021  Flexion WFL  Extension  25% stiff  Right lateral flexion 25% stiff on R   Left lateral flexion WNL  Right rotation WNL  Left rotation WNL    (Blank rows = not tested)     LE MMT:  MMT Right 01/17/2021 Left 01/17/2021  Hip flexion 4/5 4/5  Hip extension      Hip abduction      Hip adduction      Hip internal rotation      Hip external rotation      Knee flexion 5/5 5/5  Knee extension 5/5 5/5  Ankle dorsiflexion 5/5 5/5  Ankle plantarflexion      Ankle inversion      Ankle eversion       (Blank rows = not tested)   LUMBAR SPECIAL TESTS:  None    FUNCTIONAL TESTS:  5 times sit to stand:  9.2 sec Date 01/22/21 6 MWT: 1017 feet (pain 8/10) Date: 01/19/21   GAIT: Distance walked: 150 Assistive device utilized: None Level of assistance: Modified independence Comments: slow pace, small steps, Rt foot turned out (hip ER), tight ant hips           TODAY'S TREATMENT 01/29/21 Therapeutic Exercise:   Nustep L8 with UE and LE x 5 min      -LTR feet wide then narrow    Pilates   Breathing: mod cues for core  activation needed  Footwork 2 red 1 blue Parallel heels, V heels Parallel toes to heel raises Single leg work 2 red 1 blue  Double leg wide on heels  Bridging   [place] x 10 without cues   X 10 articulating spine Knee to chest and LTR to reset   Supine arms 1 red Arcs, then circles x 10  Heavy cues to slow pace, hold table top and breathe!                       Therapeutic Exercise:             -Nustep L5 UE/LE x 5 minutes             -Standing hip abduction 10 x 2 each  -4inch step down x 10 each              -bilat heel raises x10              -Alternating march 10 x 2              -Standing hip flexor stretch 30 sec x 2 each               -STS x 5  (9.2 sec) without UE from Mat              -Figure 4 stretch in sitting 30 sec x 2 each -more difficulty L>R             -Single knee to chest x 3 each             -LTR             -bridge 10   -clam with green band supine x 20             -LTR feet wide             -Pelvic tilt 10 x 1 with mod cues for technique and breathing             - PPT with alternating march x10- cues for technique and breathing   TREATMENT 01/22/21  Therapeutic Exercise:  -Nustep L4 UE/LE x 5 minutes  -Standing hip abduction 10 x 2 each  -bilat heel raises x 20  -Alternating march 10 x 2   -  Standing hip flexor stretch 30 sec x 2 each    -STS  x 10  -Figure 4 stretch in sitting 30 sec x 2 each -more difficulty L>R  -Single knee to chest x 3 each  -LTR  -bridge 10 x 2  -LTR feet wide  -Pelvic tilt 10 x 2 with mod cues for technique and breathing  -PPT with bent knee fall outs- cues for technique and breathing  -PPT with alternating bent knee fall outs- cues for slow movements and maintaining PPT   - PPT with alternating march- cues for technique and breathing     TREATMENT 01/19/21  -6 min walk test 1017 feet with pain up to 8/10 in low back   -Single knee to chest  -LTR  -LTR feet wide  -bridge 10 x 2  -Pelvic tilt 10 x 2 with mod cues  for technique and breathing  -PPT with bent knee fall outs- cues for technique and breathing  -Nustep L3 UE/LE x 5 minutes   -Standing hip flexor stretch 30 sec x 2 each        PATIENT EDUCATION:  Education details: continue HEP Person educated: Patient Education method: Explanation Education comprehension: verbalized understanding, returned demonstration, and needs further education     HOME EXERCISE PROGRAM: Access Code: [email or web address] URL: [email or web address] Date: 01/22/2021 Prepared by: Hessie Diener  Exercises Supine Lower Trunk Rotation - 2 x daily - 7 x weekly - 2 sets - 10 reps - 10 hold Supine Hip Internal and External Rotation - 2 x daily - 7 x weekly - 2 sets - 10 reps - 10 hold Supine Bridge - 2 x daily - 7 x weekly - 2 sets - 10 reps - 5 hold Standing Hip Flexor Stretch - 2 x daily - 7 x weekly - 1 sets - 5 reps - 30 hold Pelvic tilt - 1 x daily - 7 x weekly - 2 sets - 10 reps - 5 hold Bent Knee Fallouts - 1 x daily - 7 x weekly - 2 sets - 10 reps Standing Hip Abduction with Counter Support - 1 x daily - 7 x weekly - 2 sets - 10 reps - 2-3 hold Standing March with Counter Support - 1 x daily - 7 x weekly - 2 sets - 10 reps - 2-3 hold       ASSESSMENT:   CLINICAL IMPRESSION: Patient reports compliance with HEP. She reports continued improvement in standing and walking tolerance.  Continued per PT POC. Began gait cues for increased step length and hip flexion.   REHAB POTENTIAL: Excellent   CLINICAL DECISION MAKING: Stable/uncomplicated   EVALUATION COMPLEXITY: Low     GOALS: LONG TERM GOALS:    LTG Name Target Date Goal status  1 Pt will be I with HEP for hips, core  Baseline: 02/28/2021 INITIAL  2 FOTO score will improve to 53% or more to demo improved functional mobility  Baseline: 02/28/2021 INITIAL  3 Pt will increase Rt LE strength (hip) to 4+/5 in order to support spine in standing and walking Baseline: 02/28/21 INITIAL  4 Pt will be  able to stand for basic home tasks for about 30 min with min increase in Rt LE, back pain from baseline.  Baseline: 03/01/2021 INITIAL  5 Pt will be able to cross Rt LE in sitting to don shoes without hip pain (ROM ) Baseline: 03/01/2021 INITIAL  6 Pt will be able to return to light walking 20 min a few times per week  without increasing back or hip pain  Baseline: 03/01/2021 INITIAL             PLAN: PT FREQUENCY: 1-2x/week   PT DURATION: 6 weeks   PLANNED INTERVENTIONS: Therapeutic exercises, Therapeutic activity, Neuro Muscular re-education, Balance training, Gait training, Patient/Family education, Joint mobilization, Aquatic Therapy, Dry Needling, Electrical stimulation, Cryotherapy, Moist heat, and Manual therapy   PLAN FOR NEXT SESSION: check HEP additions, Nustep. HIp ROM, lower abs/core   Raeford Razor, PT 01/29/21 3:54 PM Phone: [phone] Fax: [phone]

## 2021-01-31 ENCOUNTER — Ambulatory Visit: Payer: 59 | Admitting: Physical Therapy

## 2021-02-02 ENCOUNTER — Encounter: Payer: Self-pay | Admitting: Physical Therapy

## 2021-02-02 ENCOUNTER — Ambulatory Visit: Payer: 59 | Admitting: Physical Therapy

## 2021-02-02 ENCOUNTER — Other Ambulatory Visit: Payer: Self-pay

## 2021-02-02 DIAGNOSIS — G8929 Other chronic pain: Secondary | ICD-10-CM

## 2021-02-02 DIAGNOSIS — M25551 Pain in right hip: Secondary | ICD-10-CM

## 2021-02-02 DIAGNOSIS — M6281 Muscle weakness (generalized): Secondary | ICD-10-CM | POA: Diagnosis not present

## 2021-02-02 NOTE — Therapy (Addendum)
OUTPATIENT PHYSICAL THERAPY TREATMENT NOTE   Patient Name: Colleen Wiley MRN: [id number] DOB:29-Apr-1950, 71 y.o., female Today's Date: 02/02/2021  PCP: Maurice Small, MD REFERRING PROVIDER: Maurice Small, MD   PT End of Session - 02/02/21 [phone]     Visit Number 6    Number of Visits 12    Date for PT Re-Evaluation 02/28/21    Authorization Type [employer]    PT Start Time 0802    PT Stop Time 0845    PT Time Calculation (min) 43 min    Activity Tolerance Patient tolerated treatment well    Behavior During Therapy [facility] for tasks assessed/performed              Past Medical History:  Diagnosis Date   Hypertension    History reviewed. No pertinent surgical history. 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   PRECAUTIONS: None   WEIGHT BEARING RESTRICTIONS No  SUBJECTIVE: Just stiff and tired.  Had to miss PT the other day due to class, conflict.   PAIN:  Are you having pain? Yes, stiff, min  VAS scale: rated min  and general back and hips   Pain location: bilateral low back R>L  Pain orientation: Bilateral  PAIN TYPE: stiff Pain description: constant when standing or walking Aggravating factors: standing still , walking Relieving factors: sitting. Holding and pressing down on her back in standing helps. Heat/ice     OBJECTIVE:    DIAGNOSTIC FINDINGS:  Moderate severe arthritis of the right hip with joint space narrowing, osteophyte and subarticular sclerosis. Mild to moderate arthritis left hip with joint space narrowing.   IMPRESSION: Right greater than left hip arthritis.       Mild dextroscoliosis.  Vertebral body heights are maintained. Diffuse degenerative changes with moderate degenerative changes at L2-L3 and L3-L4. Mild facet degenerative changes of the lower lumbar spine.   IMPRESSION: Degenerative changes and mild scoliosis.   MRI not available.     PATIENT SURVEYS:  FOTO 38%   SCREENING FOR RED FLAGS: Bowel or bladder incontinence: No   COGNITION:          Overall cognitive status: Within functional limits for tasks assessed                        SENSATION:          Light touch: Appears intact          Stereognosis: Appears intact          Hot/Cold: Appears intact          Proprioception: Appears intact   MUSCLE LENGTH: Hamstrings: about 90 dg each  Tight Rt IR, ER good.  L.t hip ER good, IR good POSTURE:    Stands with lean to the L and hiked Rt trunk .Rt foot turned out.    PALPATION: Painful    LUMBARAROM/PROM   A/PROM A/PROM  01/17/2021  Flexion WFL  Extension  25% stiff  Right lateral flexion 25% stiff on R   Left lateral flexion WNL  Right rotation WNL  Left rotation WNL    (Blank rows = not tested)  LE MMT:   MMT Right 01/17/2021 Left 01/17/2021  Hip flexion 4/5 4/5  Hip extension      Hip abduction      Hip adduction      Hip internal rotation      Hip external rotation      Knee flexion 5/5 5/5  Knee extension 5/5 5/5  Ankle dorsiflexion 5/5 5/5  Ankle plantarflexion      Ankle inversion      Ankle eversion       (Blank rows = not tested)   LUMBAR SPECIAL TESTS:  None    FUNCTIONAL TESTS:  5 times sit to stand:  9.2 sec Date 01/22/21 6 MWT: 1017 feet (pain 8/10) Date: 01/19/21   GAIT: Distance walked: 150 Assistive device utilized: None Level of assistance: Modified independence Comments: slow pace, small steps, Rt foot turned out (hip ER), tight ant hips           TODAY'S TREATMENT    02/02/21  Therapeutic Exercise:________________________________________________ Supine core exercises with ball under sacrum A/P pelvic  tilt  Clam bilateral and unilateral for stability  Horizontal abd red band Narrow overhead red band 04/25/22  Dead bug    LTR reset x 5 with ball removed  Dead bug 90/90 with excessive lumbar strain, better with 2 towels under for PPT Bridge x 10, then x 10 with hamstring bias (legs up 90/90)  Standing squat x 15 using bar for balance  X 15 no bar Hip abd x 15 each LE (on foam pad )  Narrow stance with dumbbell pass 5 lbs x 30 sec  Chest press and overhead press 5 lbs in tandem x 10    Manual Therapy:  PROM Rt hip in prone, IR and ER . Light pressure  to Rt lumbar paraspinals   01/29/21 Therapeutic Exercise:_______________________________________________   Nustep L8 with UE and LE x 5 min      -LTR feet wide then narrow    Pilates   Breathing: mod cues for core activation needed  Footwork 2 red 1 blue Parallel heels, V heels Parallel toes to heel raises Single leg work 2 red 1 blue  Double leg wide on heels  Bridging   [place] x 10 without cues   X 10 articulating spine Knee to chest and LTR to reset   Supine arms 1 red Arcs, then circles x 10  Heavy cues to slow pace, hold table top and breathe!                       Therapeutic Exercise:___________________________________________             -Nustep L5 UE/LE x 5 minutes             -Standing hip abduction 10 x 2 each  -4inch step down x 10 each              -bilat heel raises x10              -Alternating march 10 x 2              -Standing hip flexor stretch 30 sec x 2 each               -STS x 5  (9.2 sec) without UE from Mat              -Figure 4 stretch in sitting 30 sec x 2 each -more difficulty L>R             -  Single knee to chest x 3 each             -LTR             -bridge 10   -clam with green band supine x 20             -LTR feet wide             -Pelvic tilt 10 x 1 with mod cues for technique and breathing             - PPT with alternating march x10- cues for technique and breathing    TREATMENT 01/22/21  Therapeutic Exercise:  -Nustep L4 UE/LE x 5 minutes  -Standing hip abduction 10 x 2 each  -bilat heel raises x 20  -Alternating march 10 x 2   -Standing hip flexor stretch 30 sec x 2 each    -STS  x 10  -Figure 4 stretch in sitting 30 sec x 2 each -more difficulty L>R  -Single knee to chest x 3 each  -LTR  -bridge 10 x 2  -LTR feet wide  -Pelvic tilt 10 x 2 with mod cues for technique and breathing  -PPT with bent knee fall outs- cues for technique and breathing  -PPT with alternating bent knee fall outs- cues for slow movements and maintaining PPT   - PPT with alternating march- cues for technique and breathing     TREATMENT 01/19/21  -6 min walk test 1017 feet with pain up to 8/10 in low back   -Single knee to chest  -LTR  -LTR feet wide  -bridge 10 x 2  -Pelvic tilt 10 x 2 with mod cues for technique and breathing  -PPT with bent knee fall outs- cues for technique and breathing  -Nustep L3 UE/LE x 5 minutes   -Standing hip flexor stretch 30 sec x 2 each        PATIENT EDUCATION:  Education details: continue HEP Person educated: Patient Education method: Explanation Education comprehension: verbalized understanding, returned demonstration, and needs further education     HOME EXERCISE PROGRAM: Access Code: [email or web address] URL: [email or web address] Date: 01/22/2021 Prepared by: Hessie Diener  Exercises Supine Lower Trunk Rotation - 2 x daily - 7 x weekly - 2 sets - 10 reps - 10 hold Supine Hip Internal and External Rotation - 2 x daily - 7 x weekly - 2 sets - 10 reps - 10 hold Supine Bridge - 2 x daily - 7 x weekly - 2 sets - 10 reps - 5 hold Standing Hip Flexor Stretch - 2 x daily - 7 x weekly - 1 sets - 5 reps - 30 hold Pelvic tilt - 1 x daily - 7 x weekly - 2 sets - 10 reps - 5 hold Bent Knee Fallouts - 1 x daily - 7 x weekly - 2 sets - 10 reps Standing Hip Abduction with Counter Support - 1 x daily - 7 x weekly - 2 sets - 10 reps -  2-3 hold Standing March with Counter Support - 1 x daily - 7 x weekly - 2 sets - 10 reps - 2-3 hold       ASSESSMENT:   CLINICAL IMPRESSION: Patient doing overall, reporting less pain and daily HEP, light walking.  Needs increased time and cues for form with walking.  Cont to have pain when standing, grocery shopping.  Rt hip tight in external rotation, had a femur fracture as a girl and was cast for several  months. Stiffness improved post session.    REHAB POTENTIAL: Excellent   CLINICAL DECISION MAKING: Stable/uncomplicated   EVALUATION COMPLEXITY: Low     GOALS: LONG TERM GOALS:    LTG Name Target Date Goal status  1 Pt will be I with HEP for hips, core  Baseline: 02/28/2021 INITIAL  2 FOTO score will improve to 53% or more to demo improved functional mobility  Baseline: 02/28/2021 INITIAL  3 Pt will increase Rt LE strength (hip) to 4+/5 in order to support spine in standing and walking Baseline: 02/28/21 INITIAL  4 Pt will be able to stand for basic home tasks for about 30 min with min increase in Rt LE, back pain from baseline.  Baseline: 03/01/2021 INITIAL  5 Pt will be able to cross Rt LE in sitting to don shoes without hip pain (ROM ) Baseline: 03/01/2021 INITIAL  6 Pt will be able to return to light walking 20 min a few times per week without increasing back or hip pain  Baseline: 03/01/2021 INITIAL             PLAN: PT FREQUENCY: 1-2x/week   PT DURATION: 6 weeks   PLANNED INTERVENTIONS: Therapeutic exercises, Therapeutic activity, Neuro Muscular re-education, Balance training, Gait training, Patient/Family education, Joint mobilization, Aquatic Therapy, Dry Needling, Electrical stimulation, Cryotherapy, Moist heat, and Manual therapy   PLAN FOR NEXT SESSION: FOTO visit 6 , stand as tolerated.  Try loaded squats and dead lift.  Stretch Rt hip IR, ER.  Nustep. HIp ROM, lower abs/core   Raeford Razor, PT 02/02/21 8:56 AM Phone: [phone] Fax: [phone]

## 2021-02-05 ENCOUNTER — Ambulatory Visit: Payer: 59 | Admitting: Physical Therapy

## 2021-02-05 ENCOUNTER — Other Ambulatory Visit: Payer: Self-pay

## 2021-02-05 ENCOUNTER — Encounter: Payer: Self-pay | Admitting: Physical Therapy

## 2021-02-05 DIAGNOSIS — M6281 Muscle weakness (generalized): Secondary | ICD-10-CM | POA: Diagnosis not present

## 2021-02-05 DIAGNOSIS — M25551 Pain in right hip: Secondary | ICD-10-CM

## 2021-02-05 DIAGNOSIS — G8929 Other chronic pain: Secondary | ICD-10-CM

## 2021-02-05 DIAGNOSIS — M545 Low back pain, unspecified: Secondary | ICD-10-CM

## 2021-02-05 NOTE — Therapy (Signed)
OUTPATIENT PHYSICAL THERAPY TREATMENT NOTE   Patient Name: Colleen Wiley MRN: [id number] DOB:[birth year], 71 y.o., female Today's Date: 02/05/2021  PCP: Maurice Small, MD REFERRING PROVIDER: Maurice Small, MD   PT End of Session - 02/05/21 1553     Visit Number 7    Number of Visits 12    Date for PT Re-Evaluation 02/28/21    Authorization Type [employer]    PT Start Time 1548    PT Stop Time 1630    PT Time Calculation (min) 42 min              Past Medical History:  Diagnosis Date   Hypertension    History reviewed. No pertinent surgical history. 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   PRECAUTIONS: None   WEIGHT BEARING RESTRICTIONS No  SUBJECTIVE: I may not make it through I have dry cough from a BP med.  Some achiness Rt hip.   PAIN:  Are you having pain? Yes, stiff, min  VAS scale: rated min  and general back and hips   Pain location: bilateral low back R>L  Pain orientation: Bilateral  PAIN TYPE: stiff Pain description: constant when standing or walking Aggravating factors: standing still , walking Relieving factors: sitting. Holding and pressing down on her back in standing helps. Heat/ice     OBJECTIVE:    DIAGNOSTIC FINDINGS:  Moderate severe arthritis of the right hip with joint space narrowing, osteophyte and subarticular sclerosis. Mild to moderate arthritis left hip with joint space narrowing.   IMPRESSION: Right greater than left hip arthritis.       Mild dextroscoliosis. Vertebral body heights are maintained. Diffuse degenerative changes with moderate degenerative changes at L2-L3  and L3-L4. Mild facet degenerative changes of the lower lumbar spine.   IMPRESSION: Degenerative changes and mild scoliosis.   MRI not available.     PATIENT SURVEYS:  FOTO 38%   SCREENING FOR RED FLAGS: Bowel or bladder incontinence: No   COGNITION:          Overall cognitive status: Within functional limits for tasks assessed                        SENSATION:          Light touch: Appears intact          Stereognosis: Appears intact          Hot/Cold: Appears intact          Proprioception: Appears intact   MUSCLE LENGTH: Hamstrings: about 90 dg each  Tight Rt IR, ER good.  L.t hip ER good, IR good POSTURE:    Stands with lean to the L and hiked Rt trunk .Rt foot turned out.    PALPATION: Painful    LUMBARAROM/PROM   A/PROM A/PROM  01/17/2021  Flexion WFL  Extension  25% stiff  Right lateral flexion 25% stiff on R   Left lateral flexion WNL  Right rotation WNL  Left rotation WNL    (Blank rows = not tested)     LE MMT:   MMT Right 01/17/2021 Left 01/17/2021  Hip flexion 4/5  4/5  Hip extension      Hip abduction      Hip adduction      Hip internal rotation      Hip external rotation      Knee flexion 5/5 5/5  Knee extension 5/5 5/5  Ankle dorsiflexion 5/5 5/5  Ankle plantarflexion      Ankle inversion      Ankle eversion       (Blank rows = not tested)   LUMBAR SPECIAL TESTS:  None    FUNCTIONAL TESTS:  5 times sit to stand:  9.2 sec Date 01/22/21 6 MWT: 1017 feet (pain 8/10) Date: 01/19/21   GAIT: Distance walked: 150 Assistive device utilized: None Level of assistance: Modified independence Comments: slow pace, small steps, Rt foot turned out (hip ER), tight ant hips       TODAY'S TREATMENT    02/05/21 Therapeutic Exercise:________________________________________________ Knee to chest 30 sec x 3  Knee to opposite shoulder x 3 used sheet  LTR x 5  Bridge x 15 green band  Unilateral clam green band x 15 each Quadruped cat and camel x 8,  child pose x 3 (Airex for knee padding)  UE lift x 5, LE x 5 Bird dog x 5 , 5 sec hold  Seated hip stretching   1/2 pigeon  Adductor   Hamstring   Manual therapy:  Sidelying L4-L5 paraspinals and glute med. MFR and soft tissue work         TODAY'S TREATMENT    02/02/21  Therapeutic Exercise:________________________________________________ Supine core exercises with ball under sacrum A/P pelvic tilt  Clam bilateral and unilateral for stability  Horizontal abd red band Narrow overhead red band 04-14-22  Dead bug    LTR reset x 5 with ball removed  Dead bug 90/90 with excessive lumbar strain, better with 2 towels under for PPT Bridge x 10, then x 10 with hamstring bias (legs up 90/90)  Standing squat x 15 using bar for balance  X 15 no bar Hip abd x 15 each LE (on foam pad )  Narrow stance with dumbbell pass 5 lbs x 30 sec  Chest press and overhead press 5 lbs in tandem x 10       PATIENT EDUCATION:  Education details: continue HEP Person educated: Patient Education method: Explanation Education comprehension: verbalized understanding, returned demonstration, and needs further education     HOME EXERCISE PROGRAM: Access Code: [email or web address] URL: [email or web address] Date: 01/22/2021 Prepared by: Hessie Diener  Exercises Supine Lower Trunk Rotation - 2 x daily - 7 x weekly - 2 sets - 10 reps - 10 hold Supine Hip Internal and External Rotation - 2 x daily - 7 x weekly - 2 sets - 10 reps - 10 hold Supine Bridge - 2 x daily - 7 x weekly - 2 sets - 10 reps - 5 hold Standing Hip Flexor Stretch - 2 x daily - 7 x weekly - 1 sets - 5 reps - 30 hold Pelvic tilt - 1 x daily - 7 x weekly - 2 sets - 10 reps - 5 hold Bent Knee Fallouts - 1 x daily - 7 x weekly - 2 sets - 10 reps Standing Hip Abduction with Counter Support - 1 x daily - 7 x weekly - 2 sets - 10 reps - 2-3 hold Standing 04/14/2022 with Counter Support - 1 x daily - 7 x weekly - 2 sets - 10 reps - 2-3 hold  ASSESSMENT:   CLINICAL IMPRESSION: Patient showed good stability to quadruped and effectively stretched lumbar region with this position.  She is feeling better and stays pain minimal for several hours after session.  Cont POC.     REHAB POTENTIAL: Excellent   CLINICAL DECISION MAKING: Stable/uncomplicated   EVALUATION COMPLEXITY: Low     GOALS: LONG TERM GOALS:    LTG Name Target Date Goal status  1 Pt will be I with HEP for hips, core  Baseline: 02/28/2021 INITIAL  2 FOTO score will improve to 53% or more to demo improved functional mobility  Baseline: 02/28/2021 INITIAL  3 Pt will increase Rt LE strength (hip) to 4+/5 in order to support spine in standing and walking Baseline: 02/28/21 INITIAL  4 Pt will be able to stand for basic home tasks for about 30 min with min increase in Rt LE, back pain from baseline.  Baseline: 03/01/2021 INITIAL  5 Pt will be able to cross Rt LE in sitting to don shoes without hip pain (ROM ) Baseline: 03/01/2021 INITIAL  6 Pt will be able to return to light walking 20 min a few times per week without increasing back or hip pain  Baseline: 03/01/2021 INITIAL             PLAN: PT FREQUENCY: 1-2x/week   PT DURATION: 6 weeks   PLANNED INTERVENTIONS: Therapeutic exercises, Therapeutic activity, Neuro Muscular re-education, Balance training, Gait training, Patient/Family education, Joint mobilization, Aquatic Therapy, Dry Needling, Electrical stimulation, Cryotherapy, Moist heat, and Manual therapy   PLAN FOR NEXT SESSION: FOTO , stand as tolerated.  Try loaded squats and dead lift.  Stretch Rt hip IR, ER.  Nustep. HIp ROM, lower abs/core   Raeford Razor, PT 02/05/21 4:44 PM Phone: [phone] Fax: [phone]

## 2021-02-08 ENCOUNTER — Ambulatory Visit: Payer: 59 | Admitting: Physical Therapy

## 2021-02-08 ENCOUNTER — Encounter: Payer: Self-pay | Admitting: Physical Therapy

## 2021-02-08 ENCOUNTER — Other Ambulatory Visit: Payer: Self-pay

## 2021-02-08 DIAGNOSIS — M25551 Pain in right hip: Secondary | ICD-10-CM

## 2021-02-08 DIAGNOSIS — M6281 Muscle weakness (generalized): Secondary | ICD-10-CM | POA: Diagnosis not present

## 2021-02-08 DIAGNOSIS — M545 Low back pain, unspecified: Secondary | ICD-10-CM

## 2021-02-08 NOTE — Therapy (Signed)
OUTPATIENT PHYSICAL THERAPY TREATMENT NOTE   Patient Name: Colleen Wiley MRN: [id number] DOB:[birth year], 71 y.o., female Today's Date: 02/08/2021  PCP: Maurice Small, MD REFERRING PROVIDER: Phylliss Bob, MD   PT End of Session - 02/08/21 1153     Visit Number 8    Number of Visits 12    Date for PT Re-Evaluation 02/28/21    Authorization Type [employer]    PT Start Time 1147    PT Stop Time 1230    PT Time Calculation (min) 43 min    Activity Tolerance Patient tolerated treatment well    Behavior During Therapy [facility] for tasks assessed/performed               Past Medical History:  Diagnosis Date   Hypertension    History reviewed. No pertinent surgical history. 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   PRECAUTIONS: None   WEIGHT BEARING RESTRICTIONS No  SUBJECTIVE: I am just so stiff.  Did some extra walking around the house because I sat so much at a meeting.  Couldn't even lift my Rt LE in the shower.    PAIN:  Are you having pain? Yes, stiff VAS scale: 6/10 this AM.   general back and hips   Pain location: bilateral low back R>L  Pain orientation: Bilateral  PAIN TYPE: stiff Pain description: constant when standing or walking Aggravating factors: standing still , walking Relieving factors: sitting. Holding and pressing down on her back in standing helps. Heat/ice     OBJECTIVE:    DIAGNOSTIC FINDINGS:  Moderate severe arthritis of the right hip with joint space narrowing, osteophyte and subarticular sclerosis. Mild to moderate arthritis left hip with joint space narrowing.   IMPRESSION: Right  greater than left hip arthritis.       Mild dextroscoliosis. Vertebral body heights are maintained. Diffuse degenerative changes with moderate degenerative changes at L2-L3 and L3-L4. Mild facet degenerative changes of the lower lumbar spine.   IMPRESSION: Degenerative changes and mild scoliosis.   MRI not available.     PATIENT SURVEYS:  FOTO 38%   SCREENING FOR RED FLAGS: Bowel or bladder incontinence: No   COGNITION:          Overall cognitive status: Within functional limits for tasks assessed                        SENSATION:          Light touch: Appears intact          Stereognosis: Appears intact          Hot/Cold: Appears intact          Proprioception: Appears intact   MUSCLE LENGTH: Hamstrings: about 90 dg each  Tight Rt IR, ER good.  L.t hip ER good, IR good POSTURE:    Stands with lean to the L and hiked Rt trunk .Rt foot turned out.    PALPATION: Painful    LUMBARAROM/PROM   A/PROM A/PROM  01/17/2021  Flexion WFL  Extension  25% stiff  Right lateral flexion 25% stiff on R   Left lateral flexion WNL  Right rotation WNL  Left rotation WNL    (Blank rows = not tested)     LE MMT:   MMT Right 01/17/2021 Left 01/17/2021  Hip flexion 4/5 4/5  Hip extension      Hip abduction      Hip adduction      Hip internal rotation      Hip external rotation      Knee flexion 5/5 5/5  Knee extension 5/5 5/5  Ankle dorsiflexion 5/5 5/5  Ankle plantarflexion      Ankle inversion      Ankle eversion       (Blank rows = not tested)   LUMBAR SPECIAL TESTS:  None    FUNCTIONAL TESTS:  5 times sit to stand:  9.2 sec Date 01/22/21 6 MWT: 1017 feet (pain 8/10) Date: 01/19/21   GAIT: Distance walked: 150 Assistive device utilized: None Level of assistance: Modified independence Comments: slow pace, small steps, Rt foot turned out (hip ER), tight ant hips      [facility] Adult PT Treatment:                                                DATE: 02/08/21 Therapeutic  Exercise: NuStep L 4 UE and LE for 6 min  Seated trunk flexion with ball forward and then lateral to each side x 3  Seated ball work: spine cat/cow.  Horizontal abduction and narrow grip x 10 green band each  Hip stretching : ER/IR, piriformis  Hamstring black band stretching 30 min  Hip circles x 10  Lower trunk rotation feet wide, feet together, knees crossed x 10 Upper trunk open books x 5 Manual Therapy: Sidelying L4-L5 paraspinals and glute med. MFR and soft tissue work  Self Care: Tennis ball for self release of piriformis  , brief end of session and verbally during manual    TODAY'S TREATMENT    02/05/21 Therapeutic Exercise:________________________________________________ Knee to chest 30 sec x 3  Knee to opposite shoulder x 3 used sheet  LTR x 5  Bridge x 15 green band  Unilateral clam green band x 15 each Quadruped cat and camel x 8, child pose x 3 (Airex for knee padding)  UE lift x 5, LE x 5 Bird dog x 5 , 5 sec hold  Seated hip stretching   1/2 pigeon  Adductor   Hamstring   Manual therapy:  Sidelying L4-L5 paraspinals and glute med. MFR and soft tissue work         TODAY'S TREATMENT    02/02/21  Therapeutic Exercise:________________________________________________ Supine core exercises with ball under sacrum A/P pelvic tilt  Clam bilateral and unilateral for stability  Horizontal abd red band Narrow overhead red band 2022/04/07  Dead bug    LTR reset x 5 with ball removed  Dead bug 90/90 with excessive lumbar strain, better with 2 towels under for PPT Bridge x 10, then x 10 with hamstring bias (legs up 90/90)  Standing squat x 15 using bar for balance  X 15 no bar Hip abd x 15 each LE (on foam pad )  Narrow stance with dumbbell pass 5 lbs x 30 sec  Chest press and overhead press 5 lbs in tandem x 10       PATIENT EDUCATION:  Education details: continue HEP Person educated: Patient Education method: Explanation Education comprehension:  verbalized  understanding, returned demonstration, and needs further education     HOME EXERCISE PROGRAM: Access Code: [email or web address] URL: [email or web address] Date: 01/22/2021 Prepared by: Hessie Diener  Exercises Supine Lower Trunk Rotation - 2 x daily - 7 x weekly - 2 sets - 10 reps - 10 hold Supine Hip Internal and External Rotation - 2 x daily - 7 x weekly - 2 sets - 10 reps - 10 hold Supine Bridge - 2 x daily - 7 x weekly - 2 sets - 10 reps - 5 hold Standing Hip Flexor Stretch - 2 x daily - 7 x weekly - 1 sets - 5 reps - 30 hold Pelvic tilt - 1 x daily - 7 x weekly - 2 sets - 10 reps - 5 hold Bent Knee Fallouts - 1 x daily - 7 x weekly - 2 sets - 10 reps Standing Hip Abduction with Counter Support - 1 x daily - 7 x weekly - 2 sets - 10 reps - 2-3 hold Standing March with Counter Support - 1 x daily - 7 x weekly - 2 sets - 10 reps - 2-3 hold       ASSESSMENT:   CLINICAL IMPRESSION: Patient with increased stiffness in lumbar spine, able to improve while sitting on [employer]. She still lacks pelvic awareness and control. Advised tennis ball for self release of pain Rt glute med, piriformis .  Cont POC.     REHAB POTENTIAL: Excellent   CLINICAL DECISION MAKING: Stable/uncomplicated   EVALUATION COMPLEXITY: Low     GOALS: LONG TERM GOALS:    LTG Name Target Date Goal status  1 Pt will be I with HEP for hips, core  Baseline: 02/28/2021 INITIAL  2 FOTO score will improve to 53% or more to demo improved functional mobility  Baseline: 02/28/2021 INITIAL  3 Pt will increase Rt LE strength (hip) to 4+/5 in order to support spine in standing and walking Baseline: 02/28/21 INITIAL  4 Pt will be able to stand for basic home tasks for about 30 min with min increase in Rt LE, back pain from baseline.  Baseline: 03/01/2021 INITIAL  5 Pt will be able to cross Rt LE in sitting to don shoes without hip pain (ROM ) Baseline: 03/01/2021 INITIAL  6 Pt will be able to return to light walking  20 min a few times per week without increasing back or hip pain  Baseline: 03/01/2021 INITIAL             PLAN: PT FREQUENCY: 1-2x/week   PT DURATION: 6 weeks   PLANNED INTERVENTIONS: Therapeutic exercises, Therapeutic activity, Neuro Muscular re-education, Balance training, Gait training, Patient/Family education, Joint mobilization, Aquatic Therapy, Dry Needling, Electrical stimulation, Cryotherapy, Moist heat, and Manual therapy   PLAN FOR NEXT SESSION: goals FOTO , stand as tolerated.  Try loaded squats and dead lift.  Stretch Rt hip IR, ER.  Nustep. HIp ROM, lower abs/core   Raeford Razor, PT 02/08/21 6:49 PM Phone: [phone] Fax: [phone]

## 2021-02-13 ENCOUNTER — Other Ambulatory Visit: Payer: Self-pay

## 2021-02-13 ENCOUNTER — Ambulatory Visit: Payer: 59 | Admitting: Physical Therapy

## 2021-02-13 ENCOUNTER — Encounter: Payer: Self-pay | Admitting: Physical Therapy

## 2021-02-13 DIAGNOSIS — M25551 Pain in right hip: Secondary | ICD-10-CM

## 2021-02-13 DIAGNOSIS — G8929 Other chronic pain: Secondary | ICD-10-CM

## 2021-02-13 DIAGNOSIS — M6281 Muscle weakness (generalized): Secondary | ICD-10-CM

## 2021-02-13 NOTE — Therapy (Signed)
OUTPATIENT PHYSICAL THERAPY TREATMENT NOTE   Patient Name: Colleen Wiley MRN: [id number] DOB:11-07-1950, 71 y.o., female Today's Date: 02/13/2021  PCP: Maurice Small, MD REFERRING PROVIDER: Maurice Small, MD   PT End of Session - 02/13/21 1243     Visit Number 9    Number of Visits 12    Date for PT Re-Evaluation 02/28/21    Authorization Type [employer]    PT Start Time 1235    PT Stop Time 1315    PT Time Calculation (min) 40 min    Activity Tolerance Patient tolerated treatment well    Behavior During Therapy [facility] for tasks assessed/performed                Past Medical History:  Diagnosis Date   Hypertension    History reviewed. No pertinent surgical history. 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   PRECAUTIONS: None   WEIGHT BEARING RESTRICTIONS No  SUBJECTIVE: Doing pretty good this morning . Used the tennis ball. Pain 2/10 Rt low back.hip.  PAIN:  Are you having pain? Yes, stiff VAS scale: 2/10 this AM.   general back and hips   Pain location: bilateral low back R>L  Pain orientation: Bilateral  PAIN TYPE: stiff Pain description: constant when standing or walking Aggravating factors: standing still , walking Relieving factors: sitting. Holding and pressing down on her back in standing helps. Heat/ice     OBJECTIVE:    DIAGNOSTIC FINDINGS:  Moderate severe arthritis of the right hip with joint space narrowing, osteophyte and subarticular sclerosis. Mild to moderate arthritis left hip with joint space narrowing.   IMPRESSION: Right greater than left hip arthritis.       Mild dextroscoliosis.  Vertebral body heights are maintained. Diffuse degenerative changes with moderate degenerative changes at L2-L3 and L3-L4. Mild facet degenerative changes of the lower lumbar spine.   IMPRESSION: Degenerative changes and mild scoliosis.   MRI not available.     PATIENT SURVEYS:  FOTO 38%   SCREENING FOR RED FLAGS: Bowel or bladder incontinence: No   COGNITION:          Overall cognitive status: Within functional limits for tasks assessed                        SENSATION:          Light touch: Appears intact          Stereognosis: Appears intact          Hot/Cold: Appears intact          Proprioception: Appears intact   MUSCLE LENGTH: Hamstrings: about 90 dg each  Tight Rt IR, ER good.  L.t hip ER good, IR good POSTURE:    Stands with lean to the L and hiked Rt trunk .Rt foot turned out.    PALPATION: Painful    LUMBARAROM/PROM   A/PROM A/PROM  01/17/2021  Flexion WFL  Extension  25% stiff  Right lateral flexion 25% stiff on R   Left lateral flexion WNL  Right rotation WNL  Left rotation WNL    (Blank rows = not tested)  LE MMT:   MMT Right 01/17/2021 Left 01/17/2021  Hip flexion 4/5 4/5  Hip extension      Hip abduction      Hip adduction      Hip internal rotation      Hip external rotation      Knee flexion 5/5 5/5  Knee extension 5/5 5/5  Ankle dorsiflexion 5/5 5/5  Ankle plantarflexion      Ankle inversion      Ankle eversion       (Blank rows = not tested)   LUMBAR SPECIAL TESTS:  None    FUNCTIONAL TESTS:  5 times sit to stand:  9.2 sec Date 01/22/21 6 MWT: 1017 feet (pain 8/10) Date: 01/19/21   GAIT: Distance walked: 150 Assistive device utilized: None Level of assistance: Modified independence Comments: slow pace, small steps, Rt foot turned out (hip ER), tight ant hips      [facility] Adult PT Treatment:                                                DATE: 02/13/21  Therapeutic Exercise: Pilates Tower for LE/Core strength, postural  strength, lumbopelvic disassociation and core control.  Exercises included:  Lower trunk rotation x 5  Knee to chest x 2 each   Supine Leg [place]  Arcs single leg x 10 in parallel x 10 hip ER x 10   Circles x 10 each direction  Double leg springs   Squat x 10 Frog   Circle x 5 each direction   [place] ball under pelvis   Slastix arcs x 10 added leg lift (march)   SLR with iso shoulder press (extension)  Circles x 10    Sidelying leg springs  Hip abduction/adduction   Sidekicks x 10 (hip flex/extension)   Hip extension and abd x 10   Circles small ROM x 10   Sidelying on Arc Rt trunk stretch with light manual overpressure   Self Care: N/A   [facility] Adult PT Treatment:                                                DATE: 02/08/21 Therapeutic Exercise: NuStep L 4 UE and LE for 6 min  Seated trunk flexion with ball forward and then lateral to each side x 3  Seated ball work: spine cat/cow.  Horizontal abduction and narrow grip x 10 green band each  Hip stretching : ER/IR, piriformis  Hamstring black band stretching 30 min  Hip circles x 10  Lower trunk rotation feet wide, feet together, knees crossed x 10 Upper trunk open books x 5 Manual Therapy: Sidelying L4-L5 paraspinals and glute med. MFR and soft tissue work  Self Care: Tennis ball for self release of piriformis  , brief end of session and verbally during manual    TODAY'S TREATMENT    02/05/21 Therapeutic Exercise:________________________________________________ Knee to chest 30 sec x 3  Knee to opposite shoulder x 3 used sheet  LTR x 5  Bridge x 15 green band  Unilateral clam green band x 15 each Quadruped cat and camel x 8, child pose x 3 (Airex for knee padding)  UE lift x 5, LE x 5 Bird dog  x 5 , 5 sec hold  Seated hip stretching   1/2 pigeon  Adductor   Hamstring   Manual therapy:  Sidelying L4-L5 paraspinals and glute med. MFR and soft tissue work         TODAY'S TREATMENT    02/02/21  Therapeutic  Exercise:________________________________________________ Supine core exercises with ball under sacrum A/P pelvic tilt  Clam bilateral and unilateral for stability  Horizontal abd red band Narrow overhead red band April 15, 2022  Dead bug    LTR reset x 5 with ball removed  Dead bug 90/90 with excessive lumbar strain, better with 2 towels under for PPT Bridge x 10, then x 10 with hamstring bias (legs up 90/90)  Standing squat x 15 using bar for balance  X 15 no bar Hip abd x 15 each LE (on foam pad )  Narrow stance with dumbbell pass 5 lbs x 30 sec  Chest press and overhead press 5 lbs in tandem x 10       PATIENT EDUCATION:  Education details: continue HEP Person educated: Patient Education method: Explanation Education comprehension: verbalized understanding, returned demonstration, and needs further education     HOME EXERCISE PROGRAM: Access Code: [email or web address] URL: [email or web address] Date: 01/22/2021 Prepared by: Hessie Diener  Exercises Supine Lower Trunk Rotation - 2 x daily - 7 x weekly - 2 sets - 10 reps - 10 hold Supine Hip Internal and External Rotation - 2 x daily - 7 x weekly - 2 sets - 10 reps - 10 hold Supine Bridge - 2 x daily - 7 x weekly - 2 sets - 10 reps - 5 hold Standing Hip Flexor Stretch - 2 x daily - 7 x weekly - 1 sets - 5 reps - 30 hold Pelvic tilt - 1 x daily - 7 x weekly - 2 sets - 10 reps - 5 hold Bent Knee Fallouts - 1 x daily - 7 x weekly - 2 sets - 10 reps Standing Hip Abduction with Counter Support - 1 x daily - 7 x weekly - 2 sets - 10 reps - 2-3 hold Standing 2022/04/15 with Counter Support - 1 x daily - 7 x weekly - 2 sets - 10 reps - 2-3 hold       ASSESSMENT:   CLINICAL IMPRESSION: Patient did well on Tower.  She needs cues for engaging her core when legs are in springs and for midline/hip adduction.  No pain today limiting her treatment much improved from last visit.    REHAB POTENTIAL: Excellent   CLINICAL DECISION MAKING:  Stable/uncomplicated   EVALUATION COMPLEXITY: Low     GOALS: LONG TERM GOALS:    LTG Name Target Date Goal status  1 Pt will be I with HEP for hips, core  Baseline: 02/28/2021 INITIAL  2 FOTO score will improve to 53% or more to demo improved functional mobility  Baseline: 02/28/2021 INITIAL  3 Pt will increase Rt LE strength (hip) to 4+/5 in order to support spine in standing and walking Baseline: 02/28/21 INITIAL  4 Pt will be able to stand for basic home tasks for about 30 min with min increase in Rt LE, back pain from baseline.  Baseline: 03/01/2021 INITIAL  5 Pt will be able to cross Rt LE in sitting to don shoes without hip pain (ROM ) Baseline: 03/01/2021 INITIAL  6 Pt will be able to return to light walking 20 min a few times per week without increasing back or hip pain  Baseline: 03/01/2021 INITIAL  PLAN: PT FREQUENCY: 1-2x/week   PT DURATION: 6 weeks   PLANNED INTERVENTIONS: Therapeutic exercises, Therapeutic activity, Neuro Muscular re-education, Balance training, Gait training, Patient/Family education, Joint mobilization, Aquatic Therapy, Dry Needling, Electrical stimulation, Cryotherapy, Moist heat, and Manual therapy   PLAN FOR NEXT SESSION: goals FOTO , stand as tolerated.  Try loaded squats and dead lift.  Stretch Rt hip IR, ER.  Nustep. HIp ROM, lower abs/core   Raeford Razor, PT 02/13/21 1:22 PM Phone: [phone] Fax: [phone]

## 2021-02-15 ENCOUNTER — Other Ambulatory Visit: Payer: Self-pay

## 2021-02-15 ENCOUNTER — Ambulatory Visit: Payer: 59 | Attending: Orthopedic Surgery | Admitting: Physical Therapy

## 2021-02-15 ENCOUNTER — Encounter: Payer: Self-pay | Admitting: Physical Therapy

## 2021-02-15 DIAGNOSIS — M6281 Muscle weakness (generalized): Secondary | ICD-10-CM | POA: Insufficient documentation

## 2021-02-15 DIAGNOSIS — G8929 Other chronic pain: Secondary | ICD-10-CM | POA: Insufficient documentation

## 2021-02-15 DIAGNOSIS — M25551 Pain in right hip: Secondary | ICD-10-CM | POA: Diagnosis not present

## 2021-02-15 DIAGNOSIS — M545 Low back pain, unspecified: Secondary | ICD-10-CM | POA: Diagnosis present

## 2021-02-15 NOTE — Therapy (Addendum)
OUTPATIENT PHYSICAL THERAPY TREATMENT NOTE   Patient Name: Colleen Wiley MRN: [id number] DOB:[birth year], 71 y.o., female Today's Date: 02/15/2021  PCP: Maurice Small, MD REFERRING PROVIDER: Phylliss Bob, MD   PT End of Session - 02/15/21 1233     Visit Number 10    Number of Visits 12    Date for PT Re-Evaluation 02/28/21    Authorization Type [employer]    PT Start Time 1232    PT Stop Time 8295    PT Time Calculation (min) 42 min             Progress Note Reporting Period 01/17/21 to 02/15/21  See note below for Objective Data and Assessment of Progress/Goals.         Past Medical History:  Diagnosis Date   Hypertension    History reviewed. No pertinent surgical history. 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   PRECAUTIONS: None   WEIGHT BEARING RESTRICTIONS No  SUBJECTIVE: Pain 2/10 Rt low back.hip over last couple of days. I am walking on the treadmill up to 10 minutes.    PAIN:  Are you having pain? Yes, stiff VAS scale: 2/10 this AM.   general back and hips   Pain location: bilateral low back R>L  Pain orientation: Bilateral  PAIN TYPE: stiff Pain description: constant when standing or walking Aggravating factors: standing still , walking Relieving factors: sitting. Holding and pressing down on her back in standing helps. Heat/ice     OBJECTIVE:    DIAGNOSTIC FINDINGS:  Moderate severe arthritis of the right hip with joint space narrowing, osteophyte and subarticular sclerosis. Mild to moderate arthritis left hip with joint space narrowing.   IMPRESSION: Right greater than left hip  arthritis.       Mild dextroscoliosis. Vertebral body heights are maintained. Diffuse degenerative changes with moderate degenerative changes at L2-L3 and L3-L4. Mild facet degenerative changes of the lower lumbar spine.   IMPRESSION: Degenerative changes and mild scoliosis.   MRI not available.     PATIENT SURVEYS:  FOTO 38%, status 02/15/21: improved to 51%   SCREENING FOR RED FLAGS: Bowel or bladder incontinence: No   COGNITION:          Overall cognitive status: Within functional limits for tasks assessed                        SENSATION:          Light touch: Appears intact          Stereognosis: Appears intact          Hot/Cold: Appears intact          Proprioception: Appears intact   MUSCLE LENGTH: Hamstrings: about 90 dg each  Tight Rt IR, ER good.  L.t hip ER good, IR good POSTURE:    Stands with lean to the L and hiked Rt trunk .Rt foot turned out.    PALPATION: Painful    LUMBARAROM/PROM   A/PROM A/PROM  01/17/2021  Flexion WFL  Extension  25% stiff  Right lateral flexion 25% stiff on R   Left lateral flexion WNL  Right  rotation WNL  Left rotation WNL    (Blank rows = not tested)     LE MMT:   MMT Right 01/17/2021 Left 01/17/2021 Bilat 02/17/20  Hip flexion 4/5 4/5 4/5  Hip extension       Hip abduction       Hip adduction       Hip internal rotation       Hip external rotation       Knee flexion 5/5 5/5   Knee extension 5/5 5/5   Ankle dorsiflexion 5/5 5/5   Ankle plantarflexion       Ankle inversion       Ankle eversion        (Blank rows = not tested)   LUMBAR SPECIAL TESTS:  None    FUNCTIONAL TESTS:  5 times sit to stand:  9.2 sec Date 01/22/21 6 MWT: 1017 feet (pain 8/10) Date: 01/19/21   GAIT: Distance walked: 150 Assistive device utilized: None Level of assistance: Modified independence Comments: slow pace, small steps, Rt foot turned out (hip ER), tight ant hips    [facility] Adult PT Treatment:                                                 DATE: 02/15/21 Therapeutic Exercise: NuStep L 5 UE and LE for 5 min  Squat to chair tap 3 x 10  ( 2 sets with 10#) Deadlift to floor 10# x 13- mod cues  Seated trunk flexion with ball forward and then lateral to each side x 3  Hip stretching : ER/IR, piriformis  Lower trunk rotation feet wide, feet together   [facility] Adult PT Treatment:                                                DATE: 02/13/21  Therapeutic Exercise: Pilates Tower for LE/Core strength, postural strength, lumbopelvic disassociation and core control.  Exercises included:  Lower trunk rotation x 5  Knee to chest x 2 each   Supine Leg [place]  Arcs single leg x 10 in parallel x 10 hip ER x 10   Circles x 10 each direction  Double leg springs   Squat x 10 Frog   Circle x 5 each direction   [place] ball under pelvis   Slastix arcs x 10 added leg lift (march)   SLR with iso shoulder press (extension)  Circles x 10    Sidelying leg springs  Hip abduction/adduction   Sidekicks x 10 (hip flex/extension)   Hip extension and abd x 10   Circles small ROM x 10   Sidelying on Arc Rt trunk stretch with light manual overpressure   Self Care: N/A   [facility] Adult PT Treatment:                                                DATE: 02/08/21 Therapeutic Exercise: NuStep L 4 UE and LE for 6 min  Seated trunk flexion with ball forward and then lateral to each side x 3  Seated ball work: spine cat/cow.  Horizontal  abduction and narrow grip x 10 green band each  Hip stretching : ER/IR, piriformis  Hamstring black band stretching 30 min  Hip circles x 10  Lower trunk rotation feet wide, feet together, knees crossed x 10 Upper trunk open books x 5 Manual Therapy: Sidelying L4-L5 paraspinals and glute med. MFR and soft tissue work  Self Care: Tennis ball for self release of piriformis  , brief end of session and verbally during manual      PATIENT EDUCATION:  Education details: continue HEP Person educated:  Patient Education method: Explanation Education comprehension: verbalized understanding, returned demonstration, and needs further education     HOME EXERCISE PROGRAM: Access Code: [email or web address] URL: [email or web address] Date: 01/22/2021 Prepared by: Hessie Diener  Exercises Supine Lower Trunk Rotation - 2 x daily - 7 x weekly - 2 sets - 10 reps - 10 hold Supine Hip Internal and External Rotation - 2 x daily - 7 x weekly - 2 sets - 10 reps - 10 hold Supine Bridge - 2 x daily - 7 x weekly - 2 sets - 10 reps - 5 hold Standing Hip Flexor Stretch - 2 x daily - 7 x weekly - 1 sets - 5 reps - 30 hold Pelvic tilt - 1 x daily - 7 x weekly - 2 sets - 10 reps - 5 hold Bent Knee Fallouts - 1 x daily - 7 x weekly - 2 sets - 10 reps Standing Hip Abduction with Counter Support - 1 x daily - 7 x weekly - 2 sets - 10 reps - 2-3 hold Standing March with Counter Support - 1 x daily - 7 x weekly - 2 sets - 10 reps - 2-3 hold       ASSESSMENT:   CLINICAL IMPRESSION: Patient reports improvement in standing and walking tolerance however still limited to 10 minutes walking and 15 minutes for ADLS/standing. Her FOTO score has improved to near prediction. She is able to sit and cross leg to don shoes however it still causes mild pain. Today, began loaded squats and dead lifts with good tolerance. Min increased pain with dead lifts. She has partially met several goals and will benefit from continued PT to address remaining deficits.   REHAB POTENTIAL: Excellent   CLINICAL DECISION MAKING: Stable/uncomplicated   EVALUATION COMPLEXITY: Low     GOALS: LONG TERM GOALS:    LTG Name Target Date Goal status  1 Pt will be I with HEP for hips, core  Baseline: 02/28/2021 02/15/21 Partially met   2 FOTO score will improve to 53% or more to demo improved functional mobility  Baseline:38%  status: 51% 02/28/2021 02/15/21 Partially met  3 Pt will increase Rt LE strength (hip) to 4+/5 in order to support  spine in standing and walking Baseline:4/5 status 02/15/21 4/5 02/28/21 02/16/21 Ongoing  4 Pt will be able to stand for basic home tasks for about 30 min with min increase in Rt LE, back pain from baseline.  Baseline:   status 02/15/21: 15 minutes increased pain 30 min would need to sit 03/01/2021 02/16/21 Ongoing  5 Pt will be able to cross Rt LE in sitting to don shoes without hip pain (ROM ) Baseline: status: can cross LE but still with pain 03/01/2021 02/17/20 Partially met  6 Pt will be able to return to light walking 20 min a few times per week without increasing back or hip pain  Baseline: status: walking on TM at home 10 minutes 03/01/2021 02/15/21 ongoing  PLAN: PT FREQUENCY: 1-2x/week   PT DURATION: 6 weeks   PLANNED INTERVENTIONS: Therapeutic exercises, Therapeutic activity, Neuro Muscular re-education, Balance training, Gait training, Patient/Family education, Joint mobilization, Aquatic Therapy, Dry Needling, Electrical stimulation, Cryotherapy, Moist heat, and Manual therapy   PLAN FOR NEXT SESSION: stand as tolerated.  Assess response to loaded squats and dead lift.  Stretch Rt hip IR, ER.  Nustep. HIp ROM, lower abs/core   Hessie Diener, PTA 02/15/21 1:26 PM Phone: [phone] Fax: [phone]

## 2021-02-20 ENCOUNTER — Other Ambulatory Visit: Payer: Self-pay

## 2021-02-20 ENCOUNTER — Ambulatory Visit: Payer: 59 | Admitting: Physical Therapy

## 2021-02-20 DIAGNOSIS — M25551 Pain in right hip: Secondary | ICD-10-CM | POA: Diagnosis not present

## 2021-02-20 DIAGNOSIS — M6281 Muscle weakness (generalized): Secondary | ICD-10-CM

## 2021-02-20 DIAGNOSIS — G8929 Other chronic pain: Secondary | ICD-10-CM

## 2021-02-20 NOTE — Therapy (Signed)
OUTPATIENT PHYSICAL THERAPY TREATMENT NOTE   Patient Name: Colleen Wiley MRN: [id number] DOB:Aug 18, 1950, 71 y.o., female Today's Date: 02/20/2021  PCP: Maurice Small, MD REFERRING PROVIDER: Maurice Small, MD   PT End of Session - 02/20/21 1743     Visit Number 11    Number of Visits 12    Date for PT Re-Evaluation 02/28/21    Authorization Type [employer]    PT Start Time 1235    PT Stop Time 1319    PT Time Calculation (min) 44 min    Activity Tolerance Patient tolerated treatment well    Behavior During Therapy [facility] for tasks assessed/performed                Past Medical History:  Diagnosis Date   Hypertension    No past surgical history on file. 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   PRECAUTIONS: None   WEIGHT BEARING RESTRICTIONS No  SUBJECTIVE: I'm feeling pretty good today.  Pain 2/10 Rt low back and hip.  PAIN:  Are you having pain? Yes, stiff VAS scale: 2/10 this AM.   general back and hips   Pain location: bilateral low back R>L  Pain orientation: Bilateral  PAIN TYPE: stiff Pain description: constant when standing or walking Aggravating factors: standing still , walking Relieving factors: sitting. Holding and pressing down on her back in standing helps. Heat/ice     OBJECTIVE:    DIAGNOSTIC FINDINGS:  Moderate severe arthritis of the right hip with joint space narrowing, osteophyte and subarticular sclerosis. Mild to moderate arthritis left hip with joint space narrowing.   IMPRESSION: Right greater than left hip arthritis.       Mild dextroscoliosis. Vertebral body heights are  maintained. Diffuse degenerative changes with moderate degenerative changes at L2-L3 and L3-L4. Mild facet degenerative changes of the lower lumbar spine.   IMPRESSION: Degenerative changes and mild scoliosis.   MRI not available.     PATIENT SURVEYS:  FOTO 38%, status 02/15/21: improved to 51%   SCREENING FOR RED FLAGS: Bowel or bladder incontinence: No   COGNITION:          Overall cognitive status: Within functional limits for tasks assessed                        SENSATION:          Light touch: Appears intact          Stereognosis: Appears intact          Hot/Cold: Appears intact          Proprioception: Appears intact   MUSCLE LENGTH: Hamstrings: about 90 dg each  Tight Rt IR, ER good.  L.t hip ER good, IR good POSTURE:    Stands with lean to the L and hiked Rt trunk .Rt foot turned out.    PALPATION: Painful Rt low lumbar/hip   LUMBARAROM/PROM   A/PROM A/PROM  01/17/2021  Flexion WFL  Extension  25% stiff  Right lateral flexion 25% stiff on R   Left lateral flexion WNL  Right rotation WNL  Left rotation WNL    (Blank rows =  not tested)     LE MMT:   MMT Right 01/17/2021 Left 01/17/2021 Bilat 02/17/20  Hip flexion 4/5 4/5 4/5  Hip extension       Hip abduction       Hip adduction       Hip internal rotation       Hip external rotation       Knee flexion 5/5 5/5   Knee extension 5/5 5/5   Ankle dorsiflexion 5/5 5/5   Ankle plantarflexion       Ankle inversion       Ankle eversion        (Blank rows = not tested)   LUMBAR SPECIAL TESTS:  None    FUNCTIONAL TESTS:  5 times sit to stand:  9.2 sec Date 01/22/21 6 MWT: 1017 feet (pain 8/10) Date: 01/19/21   GAIT: Distance walked: 150 Assistive device utilized: None Level of assistance: Modified independence Comments: slow pace, small steps, Rt foot turned out (hip ER), tight ant hips   [facility] Adult PT Treatment:                                                DATE: 02/20/21 Therapeutic  Exercise: Treadmill up to 1.8 mph, 3 % grade for 7 min cues for increased stride length  Squat 15 lbs x 15  RDL 15 lb x 5  Hip abduction red looped band x 15 Hip extension red band x 15 moved to proximal thigh vs lower leg  Quadruped UE lift, knees on Airex for comfort LE extension x 15 Bird dog x 5 each side with Airex   Manual Therapy: Rt trunk stretch high mat table overpressure and light soft tissue work to Rt low lumbar , pelvis, hip Prone hip ER/IR with compression to glutes, lateral SI border and sacral traction   [facility] Adult PT Treatment:                                                DATE: 02/15/21 Therapeutic Exercise: NuStep L 5 UE and LE for 5 min  Squat to chair tap 3 x 10  ( 2 sets with 10#) Deadlift to floor 10# x 13- mod cues  Seated trunk flexion with ball forward and then lateral to each side x 3  Hip stretching : ER/IR, piriformis  Lower trunk rotation feet wide, feet together   [facility] Adult PT Treatment:                                                DATE: 02/13/21  Therapeutic Exercise: Pilates Tower for LE/Core strength, postural strength, lumbopelvic disassociation and core control.  Exercises included:  Lower trunk rotation x 5  Knee to chest x 2 each   Supine Leg [place]  Arcs single leg x 10 in parallel x 10 hip ER x 10   Circles x 10 each direction  Double leg springs   Squat x 10 Frog   Circle x 5 each direction   [place] ball under pelvis   Slastix arcs x 10 added leg lift (  march)   SLR with iso shoulder press (extension)  Circles x 10    Sidelying leg springs  Hip abduction/adduction   Sidekicks x 10 (hip flex/extension)   Hip extension and abd x 10   Circles small ROM x 10   Sidelying on Arc Rt trunk stretch with light manual overpressure   Self Care: N/A   [facility] Adult PT Treatment:                                                DATE: 02/08/21 Therapeutic Exercise: NuStep L 4 UE and LE for 6 min  Seated trunk flexion with ball  forward and then lateral to each side x 3  Seated ball work: spine cat/cow.  Horizontal abduction and narrow grip x 10 green band each  Hip stretching : ER/IR, piriformis  Hamstring black band stretching 30 min  Hip circles x 10  Lower trunk rotation feet wide, feet together, knees crossed x 10 Upper trunk open books x 5 Manual Therapy: Sidelying L4-L5 paraspinals and glute med. MFR and soft tissue work  Self Care: Tennis ball for self release of piriformis  , brief end of session and verbally during manual      PATIENT EDUCATION:  Education details: continue HEP Person educated: Patient Education method: Explanation Education comprehension: verbalized understanding, returned demonstration, and needs further education     HOME EXERCISE PROGRAM: Access Code: [email or web address] URL: [email or web address] Date: 01/22/2021 Prepared by: Hessie Diener  Exercises Supine Lower Trunk Rotation - 2 x daily - 7 x weekly - 2 sets - 10 reps - 10 hold Supine Hip Internal and External Rotation - 2 x daily - 7 x weekly - 2 sets - 10 reps - 10 hold Supine Bridge - 2 x daily - 7 x weekly - 2 sets - 10 reps - 5 hold Standing Hip Flexor Stretch - 2 x daily - 7 x weekly - 1 sets - 5 reps - 30 hold Pelvic tilt - 1 x daily - 7 x weekly - 2 sets - 10 reps - 5 hold Bent Knee Fallouts - 1 x daily - 7 x weekly - 2 sets - 10 reps Standing Hip Abduction with Counter Support - 1 x daily - 7 x weekly - 2 sets - 10 reps - 2-3 hold Standing March with Counter Support - 1 x daily - 7 x weekly - 2 sets - 10 reps - 2-3 hold       ASSESSMENT:   CLINICAL IMPRESSION: Pt able to walk for 7 min with no increase in back pain .  Increased load of lifting and worked in standing for hip strengthening. Cont PC and decide Cont VS DC   REHAB POTENTIAL: Excellent   CLINICAL DECISION MAKING: Stable/uncomplicated   EVALUATION COMPLEXITY: Low     GOALS: LONG TERM GOALS:    LTG Name Target Date Goal status  1  Pt will be I with HEP for hips, core  Baseline: 02/28/2021 02/15/21 Partially met   2 FOTO score will improve to 53% or more to demo improved functional mobility  Baseline:38%  status: 51% 02/28/2021 02/15/21 Partially met  3 Pt will increase Rt LE strength (hip) to 4+/5 in order to support spine in standing and walking Baseline:4/5 status 02/15/21 4/5 02/28/21 02/16/21 Ongoing  4 Pt will be able to stand for basic home  tasks for about 30 min with min increase in Rt LE, back pain from baseline.  Baseline:   status 02/15/21: 15 minutes increased pain 30 min would need to sit 03/01/2021 02/16/21 Ongoing  5 Pt will be able to cross Rt LE in sitting to don shoes without hip pain (ROM ) Baseline: status: can cross LE but still with pain 03/01/2021 02/17/20 Partially met  6 Pt will be able to return to light walking 20 min a few times per week without increasing back or hip pain  Baseline: status: walking on TM at home 10 minutes 03/01/2021 02/15/21 ongoing             PLAN: PT FREQUENCY: 1-2x/week   PT DURATION: 6 weeks   PLANNED INTERVENTIONS: Therapeutic exercises, Therapeutic activity, Neuro Muscular re-education, Balance training, Gait training, Patient/Family education, Joint mobilization, Aquatic Therapy, Dry Needling, Electrical stimulation, Cryotherapy, Moist heat, and Manual therapy   PLAN FOR NEXT SESSION: Renew vd DC? stand as tolerated.  Assess response to loaded squats and dead lift.  Stretch Rt hip IR, ER.  Nustep. HIp ROM, lower abs/core   Raeford Razor, PT 02/20/21 5:49 PM Phone: [phone] Fax: [phone]

## 2021-02-22 ENCOUNTER — Other Ambulatory Visit: Payer: Self-pay

## 2021-02-22 ENCOUNTER — Ambulatory Visit: Payer: 59 | Admitting: Physical Therapy

## 2021-02-22 DIAGNOSIS — M6281 Muscle weakness (generalized): Secondary | ICD-10-CM

## 2021-02-22 DIAGNOSIS — M25551 Pain in right hip: Secondary | ICD-10-CM

## 2021-02-22 DIAGNOSIS — G8929 Other chronic pain: Secondary | ICD-10-CM

## 2021-02-22 NOTE — Patient Instructions (Signed)

## 2021-02-22 NOTE — Therapy (Signed)
OUTPATIENT PHYSICAL THERAPY TREATMENT NOTE   Patient Name: Colleen Wiley MRN: [id number] DOB:Mar 10, 1950, 71 y.o., female Today's Date: 02/22/2021  PCP: Maurice Small, MD REFERRING PROVIDER: Phylliss Bob, MD   PT End of Session - 02/22/21 1026     Visit Number 12    Date for PT Re-Evaluation 02/28/21    Authorization Type [employer]    PT Start Time 1020    PT Stop Time 1100    PT Time Calculation (min) 40 min                Past Medical History:  Diagnosis Date   Hypertension    No past surgical history on file. 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   PRECAUTIONS: None   WEIGHT BEARING RESTRICTIONS No  SUBJECTIVE: I'm feeling pretty good today.  Pain 1/10 Rt low back and hip.  PAIN:  Are you having pain? Yes, stiff VAS scale: 1/10 this AM.   general back and hips   Pain location: bilateral low back R>L  Pain orientation: Bilateral  PAIN TYPE: stiff Pain description: constant when standing or walking Aggravating factors: standing still , walking Relieving factors: sitting. Holding and pressing down on her back in standing helps. Heat/ice     OBJECTIVE:    DIAGNOSTIC FINDINGS:  Moderate severe arthritis of the right hip with joint space narrowing, osteophyte and subarticular sclerosis. Mild to moderate arthritis left hip with joint space narrowing.   IMPRESSION: Right greater than left hip arthritis.       Mild dextroscoliosis. Vertebral body heights are maintained. Diffuse degenerative changes with moderate degenerative changes at L2-L3 and L3-L4. Mild facet degenerative changes of the lower  lumbar spine.   IMPRESSION: Degenerative changes and mild scoliosis.   MRI not available.     PATIENT SURVEYS:  FOTO 38%, status 02/15/21: improved to 51%   SCREENING FOR RED FLAGS: Bowel or bladder incontinence: No   COGNITION:          Overall cognitive status: Within functional limits for tasks assessed                        SENSATION:          Light touch: Appears intact          Stereognosis: Appears intact          Hot/Cold: Appears intact          Proprioception: Appears intact   MUSCLE LENGTH: Hamstrings: about 90 dg each  Tight Rt IR, ER good.  L.t hip ER good, IR good POSTURE:    Stands with lean to the L and hiked Rt trunk .Rt foot turned out.    PALPATION: Painful Rt low lumbar/hip   LUMBARAROM/PROM   A/PROM A/PROM  01/17/2021  Flexion WFL  Extension  25% stiff  Right lateral flexion 25% stiff on R   Left lateral flexion WNL  Right rotation WNL  Left rotation WNL    (Blank rows = not tested)     LE MMT:   MMT Right 01/17/2021 Left 01/17/2021 Bilat 02/17/20  Hip flexion 4/5 4/5 4/5  Hip extension       Hip abduction       Hip adduction       Hip internal rotation       Hip external rotation       Knee flexion 5/5 5/5   Knee extension 5/5 5/5   Ankle dorsiflexion 5/5 5/5   Ankle plantarflexion       Ankle inversion       Ankle eversion        (Blank rows = not tested)   LUMBAR SPECIAL TESTS:  None    FUNCTIONAL TESTS:  5 times sit to stand:  9.2 sec Date 01/22/21 6 MWT: 1017 feet (pain 8/10) Date: 01/19/21   GAIT: Distance walked: 150 Assistive device utilized: None Level of assistance: Modified independence Comments: slow pace, small steps, Rt foot turned out (hip ER), tight ant hips    [facility] Adult PT Treatment:                                                DATE: 02/22/21 Therapeutic Exercise: Treadmill 6 min 1.9 mph cues for increased stride Wall slider lunge (washcloth) x 10 each  Wall squat 10 sec hold added tib taps and heel lifts  last 2 reps  Bridge with green band x 10  Bridge hover with unilateral knee fall out green x 10 SL clam green band x 15  Piriformis stretch, figure 4 push pull  Self Care: Dry needling, HEP      [facility] Adult PT Treatment:                                                DATE: 02/20/21 Therapeutic Exercise: Treadmill up to 1.8 mph, 3 % grade for 7 min cues for increased stride length  Squat 15 lbs x 15  RDL 15 lb x 5  Hip abduction red looped band x 15 Hip extension red band x 15 moved to proximal thigh vs lower leg  Quadruped UE lift, knees on Airex for comfort LE extension x 15 Bird dog x 5 each side with Airex   Manual Therapy: Rt trunk stretch high mat table overpressure and light soft tissue work to Rt low lumbar , pelvis, hip Prone hip ER/IR with compression to glutes, lateral SI border and sacral traction   [facility] Adult PT Treatment:                                                DATE: 02/15/21 Therapeutic Exercise: NuStep L 5 UE and LE for 5 min  Squat to chair tap 3 x 10  ( 2 sets with 10#) Deadlift to floor 10# x 13- mod cues  Seated trunk flexion with ball forward and then lateral to each side x 3  Hip stretching : ER/IR, piriformis  Lower trunk rotation feet wide, feet together   [facility] Adult PT Treatment:  DATE: 02/13/21  Therapeutic Exercise: Pilates Tower for LE/Core strength, postural strength, lumbopelvic disassociation and core control.  Exercises included:  Lower trunk rotation x 5  Knee to chest x 2 each   Supine Leg [place]  Arcs single leg x 10 in parallel x 10 hip ER x 10   Circles x 10 each direction  Double leg springs   Squat x 10 Frog   Circle x 5 each direction   [place] ball under pelvis   Slastix arcs x 10 added leg lift (march)   SLR with iso shoulder press (extension)  Circles x 10    Sidelying leg springs  Hip abduction/adduction   Sidekicks x 10 (hip flex/extension)   Hip extension and  abd x 10   Circles small ROM x 10   Sidelying on Arc Rt trunk stretch with light manual overpressure   Self Care: N/A   [facility] Adult PT Treatment:                                                DATE: 02/08/21 Therapeutic Exercise: NuStep L 4 UE and LE for 6 min  Seated trunk flexion with ball forward and then lateral to each side x 3  Seated ball work: spine cat/cow.  Horizontal abduction and narrow grip x 10 green band each  Hip stretching : ER/IR, piriformis  Hamstring black band stretching 30 min  Hip circles x 10  Lower trunk rotation feet wide, feet together, knees crossed x 10 Upper trunk open books x 5 Manual Therapy: Sidelying L4-L5 paraspinals and glute med. MFR and soft tissue work  Self Care: Tennis ball for self release of piriformis  , brief end of session and verbally during manual      PATIENT EDUCATION:  Education details: continue HEP Person educated: Patient Education method: Explanation Education comprehension: verbalized understanding, returned demonstration, and needs further education     HOME EXERCISE PROGRAM: Access Code: [email or web address] URL: [email or web address]       ASSESSMENT:   CLINICAL IMPRESSION: Patient continues to make progress with PT.  She is now able to cross her Rt knee over the Lt thigh. She was given an upgraded HEP with bands for hip strength.  She is interested in trigger point dry needling and we may try to get that in before the end of next week. I asked her to think about plans post PT, may consider renew x 1 pr week.     REHAB POTENTIAL: Excellent   CLINICAL DECISION MAKING: Stable/uncomplicated   EVALUATION COMPLEXITY: Low     GOALS: LONG TERM GOALS:    LTG Name Target Date Goal status  1 Pt will be I with HEP for hips, core  Baseline: 02/28/2021 02/15/21 Partially met   2 FOTO score will improve to 53% or more to demo improved functional mobility  Baseline:38%  status: 51% 02/28/2021 02/15/21 Partially met  3  Pt will increase Rt LE strength (hip) to 4+/5 in order to support spine in standing and walking Baseline:4/5 status 02/15/21 4/5 02/28/21 02/16/21 Ongoing  4 Pt will be able to stand for basic home tasks for about 30 min with min increase in Rt LE, back pain from baseline.  Baseline:   status 02/15/21: 15 minutes increased pain 30 min would need to sit 03/01/2021 02/16/21 Ongoing  5 Pt will be able to  cross Rt LE in sitting to don shoes without hip pain (ROM ) Baseline: status: can cross LE but still with pain 03/01/2021 02/17/20 Partially met  6 Pt will be able to return to light walking 20 min a few times per week without increasing back or hip pain  Baseline: status: walking on TM at home 10 minutes 03/01/2021 02/15/21 ongoing             PLAN: PT FREQUENCY: 1-2x/week   PT DURATION: 6 weeks   PLANNED INTERVENTIONS: Therapeutic exercises, Therapeutic activity, Neuro Muscular re-education, Balance training, Gait training, Patient/Family education, Joint mobilization, Aquatic Therapy, Dry Needling, Electrical stimulation, Cryotherapy, Moist heat, and Manual therapy   PLAN FOR NEXT SESSION: Has 2 mor Renew vd DC? stand as tolerated.  Consider DN.   TM.  How were band ex.? . HIp ROM, lower abs/core   Raeford Razor, PT 02/22/21 12:02 PM Phone: [phone] Fax: [phone]

## 2021-02-27 ENCOUNTER — Ambulatory Visit: Payer: 59 | Admitting: Physical Therapy

## 2021-02-27 ENCOUNTER — Other Ambulatory Visit: Payer: Self-pay

## 2021-02-27 DIAGNOSIS — M25551 Pain in right hip: Secondary | ICD-10-CM | POA: Diagnosis not present

## 2021-02-27 DIAGNOSIS — G8929 Other chronic pain: Secondary | ICD-10-CM

## 2021-02-27 DIAGNOSIS — M6281 Muscle weakness (generalized): Secondary | ICD-10-CM

## 2021-02-27 NOTE — Therapy (Signed)
OUTPATIENT PHYSICAL THERAPY TREATMENT NOTE   Patient Name: Colleen Wiley MRN: [id number] DOB:Oct 24, 1950, 71 y.o., female Today's Date: 02/27/2021  PCP: Maurice Small, MD REFERRING PROVIDER: Maurice Small, MD   PT End of Session - 02/27/21 1236     Visit Number 13    Number of Visits 17    Date for PT Re-Evaluation 04/10/21    Authorization Type [employer]    PT Start Time 1235    PT Stop Time 1315    PT Time Calculation (min) 40 min    Activity Tolerance Patient tolerated treatment well    Behavior During Therapy [facility] for tasks assessed/performed                 Past Medical History:  Diagnosis Date   Hypertension    No past surgical history on file. 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   PRECAUTIONS: None   WEIGHT BEARING RESTRICTIONS No  SUBJECTIVE:  Was at a game last night and My friend said I looked good going up stairs.  This morning it is the best its ever been.     I'm feeling pretty good today.  Pain 1/10 Rt low back and hip.  PAIN:  Are you having pain? Yes, stiff VAS scale: 1/10 this AM.   general back and hips   Pain location: bilateral low back R>L  Pain orientation: Bilateral  PAIN TYPE: stiff Pain description: constant when standing or walking Aggravating factors: standing still , walking Relieving factors: sitting. Holding and pressing down on her back in standing helps. Heat/ice     OBJECTIVE:    DIAGNOSTIC FINDINGS:  Moderate severe arthritis of the right hip with joint space narrowing, osteophyte and subarticular sclerosis. Mild to moderate arthritis left hip with joint space  narrowing.   IMPRESSION: Right greater than left hip arthritis.       Mild dextroscoliosis. Vertebral body heights are maintained. Diffuse degenerative changes with moderate degenerative changes at L2-L3 and L3-L4. Mild facet degenerative changes of the lower lumbar spine.   IMPRESSION: Degenerative changes and mild scoliosis.   MRI not available.     PATIENT SURVEYS:  FOTO 38%, status 02/15/21: improved to 51%   SCREENING FOR RED FLAGS: Bowel or bladder incontinence: No   COGNITION:          Overall cognitive status: Within functional limits for tasks assessed                        SENSATION:          Light touch: Appears intact          Stereognosis: Appears intact          Hot/Cold: Appears intact          Proprioception: Appears intact   MUSCLE LENGTH: Hamstrings: about 90 dg each  Tight Rt IR, ER good.  L.t hip ER good, IR good POSTURE:    Stands with lean to the L and hiked Rt trunk .Rt foot turned out.    PALPATION: Painful Rt low lumbar/hip   LUMBARAROM/PROM   A/PROM A/PROM  01/17/2021 02/27/21  Flexion  [facility] WFL no pain   Extension  25% stiff WFL min stiffness  Right lateral flexion 25% stiff on R  WNL  Left lateral flexion WNL WNL  Right rotation WNL WNL  Lt rotation WNL  WNL    (Blank rows = not tested)     LE MMT:   MMT Right 01/17/2021 Left 01/17/2021 Bilat 02/17/20  Hip flexion 4/5 4/5 4/5  Hip extension     4/5  Hip abduction       Hip adduction       Hip internal rotation       Hip external rotation       Knee flexion 5/5 5/5 5/5  Knee extension 5/5 5/5 5/5  Ankle dorsiflexion 5/5 5/5 5/5  Ankle plantarflexion       Ankle inversion       Ankle eversion        (Blank rows = not tested)   LUMBAR SPECIAL TESTS:  None    FUNCTIONAL TESTS:  5 times sit to stand:  9.2 sec Date 01/22/21 6 MWT: 1017  feet (pain 8/10) Date: 01/19/21    6 MWT 1273 feet (pain 1/10) date 02/27/21   GAIT: Distance walked: 150 Assistive device utilized:  None Level of assistance: Modified independence Comments: slow pace, small steps, Rt foot turned out (hip ER), tight ant hips      [facility] Adult PT Treatment:                                                DATE: 02/27/21 Therapeutic Exercise: 6 min walk Standing hip abduction red looped band x 15  Hip abduction 3 lbs x 15  Hip extension 3 lbs x 15  Self Care: Progress, plan   [facility] Adult PT Treatment:                                                DATE: 02/22/21 Therapeutic Exercise: Treadmill 6 min 1.9 mph cues for increased stride Wall slider lunge (washcloth) x 10 each  Wall squat 10 sec hold added tib taps and heel lifts last 2 reps  Bridge with green band x 10  Bridge hover with unilateral knee fall out green x 10 SL clam green band x 15  Piriformis stretch, figure 4 push pull  Self Care: Dry needling, HEP      [facility] Adult PT Treatment:                                                DATE: 02/20/21 Therapeutic Exercise: Treadmill up to 1.8 mph, 3 % grade for 7 min cues for increased stride length  Squat 15 lbs x 15  RDL 15 lb x 5  Hip abduction red looped band x 15 Hip extension red band x 15 moved to proximal thigh vs lower leg  Quadruped UE lift, knees on Airex for comfort LE extension x 15 Bird dog x 5 each side with Airex   Manual Therapy: Rt trunk stretch high mat table overpressure and light soft tissue work to Rt low lumbar , pelvis,  hip Prone hip ER/IR with compression to glutes, lateral SI border and sacral traction     PATIENT EDUCATION:  Education details: continue HEP Person educated: Patient Education method: Explanation Education comprehension: verbalized understanding, returned demonstration, and needs further education     HOME EXERCISE PROGRAM: Access Code: [email or web address] URL: [email or web address]       ASSESSMENT:   CLINICAL IMPRESSION:   Patient was renewed for about 4 more visits in the next 6 weeks. She has improved in  functional ability and walking tolerance.  She cont to have hip weakness and decreased confidence with steps, especially with no UE support.  She frequently does stairs when attending basketball games and is unable to do without holding a rail or her husband/friend. She walked further in her 6 min walk test and with min discomfort (was 8/10 last time).  She will continue to benefit from skilled PT to improve her strength and confidence with gait, community ambulation.    REHAB POTENTIAL: Excellent   CLINICAL DECISION MAKING: Stable/uncomplicated   EVALUATION COMPLEXITY: Low     GOALS: LONG TERM GOALS:    LTG Name Target Date Goal status  1 Pt will be I with HEP for hips, core  Baseline: 04/10/21 02/27/21 ongoing   2 FOTO score will improve to 53% or more to demo improved functional mobility  Baseline:38%  status: 51% 04/10/21 02/27/21 Partially met  3 Pt will increase Rt LE strength (hip) to 4+/5 in order to support spine in standing and walking Baseline:4/5 status 02/27/21 4/5 04/10/21 02/27/21 Ongoing  4 Pt will be able to stand for basic home tasks for about 30 min with min increase in Rt LE, back pain from baseline.  Baseline:   status 02/27/21: met 04/10/21 02/27/21 met  5 Pt will be able to cross Rt LE in sitting to don shoes without hip pain (ROM ) Baseline: status: can cross LE but still with discomfort 04/10/21 02/28/20 met  6 Pt will be able to return to light walking 20 min a few times per week without increasing back or hip pain  Baseline: status: walking on TM at home 10 minutes, no major increase in pain  04/10/21 02/15/21 met   7 Pt will report more confidence with steps in the community , basketball games due to increased LE strength.   04/10/21  new     PLAN: PT FREQUENCY: 1 x per week    PT DURATION: 4 weeks   PLANNED INTERVENTIONS: Therapeutic exercises, Therapeutic activity, Neuro Muscular re-education, Balance training, Gait training, Patient/Family education, Joint  mobilization, Aquatic Therapy, Dry Needling, Electrical stimulation, Cryotherapy, Moist heat, and Manual therapy   PLAN FOR NEXT SESSION: DN? stand as tolerated.  Steps, glute strength . Consider DN.   TM.  How were band ex.? . HIp ROM, lower abs/core   Raeford Razor, PT 02/27/21 1:24 PM Phone: [phone] Fax: [phone]

## 2021-02-28 NOTE — Therapy (Signed)
OUTPATIENT PHYSICAL THERAPY TREATMENT NOTE   Patient Name: Colleen Wiley MRN: [id number] DOB:[birth year], 71 y.o., female Today's Date: 03/01/2021  PCP: Maurice Small, MD REFERRING PROVIDER: Phylliss Bob, MD   PT End of Session - 03/01/21 1159     Visit Number 14    Date for PT Re-Evaluation 04/10/21    Authorization Type [employer]    PT Start Time 1148    PT Stop Time 1229    PT Time Calculation (min) 41 min                  Past Medical History:  Diagnosis Date   Hypertension    History reviewed. No pertinent surgical history. 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   PRECAUTIONS: None   WEIGHT BEARING RESTRICTIONS No  SUBJECTIVE:  More stiff than the other day, Still OK though.  2/10. Rt hip and back.  Why am I sos stiff? Is it hydration?  PAIN:  Are you having pain? Yes, stiff VAS scale: 2/10 this AM.   general back and hips   Pain location: bilateral low back R>L  Pain orientation: Bilateral  PAIN TYPE: stiff Pain description: constant when standing or walking Aggravating factors: standing still , walking Relieving factors: sitting. Holding and pressing down on her back in standing helps. Heat/ice     OBJECTIVE:    DIAGNOSTIC FINDINGS:  Moderate severe arthritis of the right hip with joint space narrowing, osteophyte and subarticular sclerosis. Mild to moderate arthritis left hip with joint space narrowing.   IMPRESSION: Right greater than left hip arthritis.       Mild dextroscoliosis. Vertebral body heights are maintained. Diffuse degenerative changes with moderate degenerative changes  at L2-L3 and L3-L4. Mild facet degenerative changes of the lower lumbar spine.   IMPRESSION: Degenerative changes and mild scoliosis.   MRI not available.     PATIENT SURVEYS:  FOTO 38%, status 02/15/21: improved to 51%   SCREENING FOR RED FLAGS: Bowel or bladder incontinence: No   COGNITION:          Overall cognitive status: Within functional limits for tasks assessed                        SENSATION:          Light touch: Appears intact          Stereognosis: Appears intact          Hot/Cold: Appears intact          Proprioception: Appears intact   MUSCLE LENGTH: Hamstrings: about 90 dg each  Tight Rt IR, ER good.  L.t hip ER good, IR good POSTURE:    Stands with lean to the L and hiked Rt trunk .Rt foot turned out.    PALPATION: Painful Rt low lumbar/hip   LUMBARAROM/PROM   A/PROM A/PROM  01/17/2021 02/27/21  Flexion WFL WFL no pain   Extension  25% stiff WFL min stiffness  Right lateral flexion 25% stiff on R  WNL  Left lateral flexion WNL WNL  Right rotation WNL WNL  Lt rotation WNL  WNL    (Blank  rows = not tested)     LE MMT:   MMT Right 01/17/2021 Left 01/17/2021 Bilat 02/17/20  Hip flexion 4/5 4/5 4/5  Hip extension     4/5  Hip abduction       Hip adduction       Hip internal rotation       Hip external rotation       Knee flexion 5/5 5/5 5/5  Knee extension 5/5 5/5 5/5  Ankle dorsiflexion 5/5 5/5 5/5  Ankle plantarflexion       Ankle inversion       Ankle eversion        (Blank rows = not tested)   LUMBAR SPECIAL TESTS:  None    FUNCTIONAL TESTS:  5 times sit to stand:  9.2 sec Date 01/22/21 6 MWT: 1017  feet (pain 8/10) Date: 01/19/21    6 MWT 1273 feet (pain 1/10) date 02/27/21   GAIT: Distance walked: 150 Assistive device utilized: None Level of assistance: Modified independence Comments: slow pace, small steps, Rt foot turned out (hip ER), tight ant hips    [facility] Adult PT Treatment:                                                DATE:  03/01/21 Therapeutic Exercise: Supine knee to chest  Hip circles blue band x 10 each side  LTR with wide knees x 10  Sidelying book openings x 5  HIp abduction red loop at lower leg  x10 Sidekick no loop x 10  1/2 pigeon each side 1 min  Sit to stand no wgt Squat 15 lbs x 11  Sumo Squat  x 10  Standing roll back bar extension bilateral and then single arm facing sideways  Tandem stance with row with roll back bar x 10  Supine hip flexor  x2   [facility] Adult PT Treatment:                                                DATE: 02/27/21 Therapeutic Exercise: 6 min walk Standing hip abduction red looped band x 15  Hip abduction 3 lbs x 15  Hip extension 3 lbs x 15  Self Care: Progress, plan   [facility] Adult PT Treatment:                                                DATE: 02/22/21 Therapeutic Exercise: Treadmill 6 min 1.9 mph cues for increased stride Wall slider lunge (washcloth) x 10 each  Wall squat 10 sec hold added tib taps and heel lifts last 2 reps  Bridge with green band x 10  Bridge hover with unilateral knee fall out green x 10 SL clam green band x 15  Piriformis stretch, figure 4 push pull  Self Care: Dry needling, HEP      [facility] Adult PT Treatment:  DATE: 02/20/21 Therapeutic Exercise: Treadmill up to 1.8 mph, 3 % grade for 7 min cues for increased stride length  Squat 15 lbs x 15  RDL 15 lb x 5  Hip abduction red looped band x 15 Hip extension red band x 15 moved to proximal thigh vs lower leg  Quadruped UE lift, knees on Airex for comfort LE extension x 15 Bird dog x 5 each side with Airex   Manual Therapy: Rt trunk stretch high mat table overpressure and light soft tissue work to Rt low lumbar , pelvis, hip Prone hip ER/IR with compression to glutes, lateral SI border and sacral traction     PATIENT EDUCATION:  Education details: continue HEP Person educated: Patient Education method: Explanation Education  comprehension: verbalized understanding, returned demonstration, and needs further education     HOME EXERCISE PROGRAM: Access Code: [email or web address] URL: [email or web address]       ASSESSMENT:   CLINICAL IMPRESSION:   New POC starts next week for less frequency of visits.  Stiffness upon entering clinic and relieved with simple stretches. Offered hip flexor stretching when sitting a her desk/laptop too much.    REHAB POTENTIAL: Excellent   CLINICAL DECISION MAKING: Stable/uncomplicated   EVALUATION COMPLEXITY: Low     GOALS: LONG TERM GOALS:    LTG Name Target Date Goal status  1 Pt will be I with HEP for hips, core  Baseline: 04/10/21 02/27/21 ongoing   2 FOTO score will improve to 53% or more to demo improved functional mobility  Baseline:38%  status: 51% 04/10/21 02/27/21 Partially met  3 Pt will increase Rt LE strength (hip) to 4+/5 in order to support spine in standing and walking Baseline:4/5 status 02/27/21 4/5 04/10/21 02/27/21 Ongoing  4 Pt will be able to stand for basic home tasks for about 30 min with min increase in Rt LE, back pain from baseline.  Baseline:   status 02/27/21: met 04/10/21 02/27/21 met  5 Pt will be able to cross Rt LE in sitting to don shoes without hip pain (ROM ) Baseline: status: can cross LE but still with discomfort 04/10/21 02/28/20 met  6 Pt will be able to return to light walking 20 min a few times per week without increasing back or hip pain  Baseline: status: walking on TM at home 10 minutes, no major increase in pain  04/10/21 02/15/21 met   7 Pt will report more confidence with steps in the community , basketball games due to increased LE strength.   04/10/21  new     PLAN: PT FREQUENCY: 1 x per week    PT DURATION: 4 weeks- 6 weeks (4 more appts )   PLANNED INTERVENTIONS: Therapeutic exercises, Therapeutic activity, Neuro Muscular re-education, Balance training, Gait training, Patient/Family education, Joint mobilization,  Aquatic Therapy, Dry Needling, Electrical stimulation, Cryotherapy, Moist heat, and Manual therapy   PLAN FOR NEXT SESSION: DN? stand as tolerated.  Steps, glute strength . Consider DN.   TM.  How were band ex.? . HIp ROM, lower abs/core   Raeford Razor, PT 03/01/21 12:37 PM Phone: [phone] Fax: [phone]

## 2021-03-01 ENCOUNTER — Ambulatory Visit: Payer: 59 | Admitting: Physical Therapy

## 2021-03-01 ENCOUNTER — Encounter: Payer: Self-pay | Admitting: Physical Therapy

## 2021-03-01 ENCOUNTER — Other Ambulatory Visit: Payer: Self-pay

## 2021-03-01 DIAGNOSIS — M6281 Muscle weakness (generalized): Secondary | ICD-10-CM

## 2021-03-01 DIAGNOSIS — M25551 Pain in right hip: Secondary | ICD-10-CM | POA: Diagnosis not present

## 2021-03-01 DIAGNOSIS — M545 Low back pain, unspecified: Secondary | ICD-10-CM

## 2021-03-01 DIAGNOSIS — G8929 Other chronic pain: Secondary | ICD-10-CM

## 2021-03-06 ENCOUNTER — Encounter: Payer: Self-pay | Admitting: Physical Therapy

## 2021-03-06 ENCOUNTER — Other Ambulatory Visit: Payer: Self-pay

## 2021-03-06 ENCOUNTER — Ambulatory Visit: Payer: 59 | Admitting: Physical Therapy

## 2021-03-06 DIAGNOSIS — G8929 Other chronic pain: Secondary | ICD-10-CM

## 2021-03-06 DIAGNOSIS — M25551 Pain in right hip: Secondary | ICD-10-CM

## 2021-03-06 DIAGNOSIS — M545 Low back pain, unspecified: Secondary | ICD-10-CM

## 2021-03-06 DIAGNOSIS — M6281 Muscle weakness (generalized): Secondary | ICD-10-CM

## 2021-03-06 NOTE — Therapy (Signed)
OUTPATIENT PHYSICAL THERAPY TREATMENT NOTE   Patient Name: Colleen Wiley MRN: [id number] DOB:September 10, 1950, 71 y.o., female Today's Date: 03/06/2021  PCP: Maurice Small, MD REFERRING PROVIDER: Maurice Small, MD   PT End of Session - 03/06/21 1029     Visit Number 15    Number of Visits 17    Date for PT Re-Evaluation 04/10/21    Authorization Type [employer]    PT Start Time 1020    PT Stop Time 1105    PT Time Calculation (min) 45 min    Activity Tolerance Patient tolerated treatment well    Behavior During Therapy [facility] for tasks assessed/performed                   Past Medical History:  Diagnosis Date   Hypertension    History reviewed. No pertinent surgical history. 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   PRECAUTIONS: None   WEIGHT BEARING RESTRICTIONS No  SUBJECTIVE:  My back is doing good today, no pain this AM.  Some stiffness today.     PAIN:  Are you having pain? No, stiff VAS scale: 0/10 this AM.   general back and hips   Pain location: bilateral low back R>L  Pain orientation: Bilateral  PAIN TYPE: stiff Pain description: constant when standing or walking Aggravating factors: standing still , walking Relieving factors: sitting. Holding and pressing down on her back in standing helps. Heat/ice     OBJECTIVE:    DIAGNOSTIC FINDINGS:  Moderate severe arthritis of the right hip with joint space narrowing, osteophyte and subarticular sclerosis. Mild to moderate arthritis left hip with joint space narrowing.   IMPRESSION: Right greater than left hip arthritis.       Mild dextroscoliosis.  Vertebral body heights are maintained. Diffuse degenerative changes with moderate degenerative changes at L2-L3 and L3-L4. Mild facet degenerative changes of the lower lumbar spine.   IMPRESSION: Degenerative changes and mild scoliosis.   MRI not available.     PATIENT SURVEYS:  FOTO 38%, status 02/15/21: improved to 51%   SCREENING FOR RED FLAGS: Bowel or bladder incontinence: No   COGNITION:          Overall cognitive status: Within functional limits for tasks assessed                        SENSATION:          Light touch: Appears intact          Stereognosis: Appears intact          Hot/Cold: Appears intact          Proprioception: Appears intact   MUSCLE LENGTH: Hamstrings: about 90 dg each  Tight Rt IR, ER good.  L.t hip ER good, IR good POSTURE:    Stands with lean to the L and hiked Rt trunk .Rt foot turned out.    PALPATION: Painful Rt low lumbar/hip   LUMBARAROM/PROM   A/PROM A/PROM  01/17/2021 02/27/21  Flexion WFL WFL no pain   Extension  25% stiff WFL min stiffness  Right lateral flexion 25% stiff on R  WNL  Left lateral flexion WNL WNL  Right rotation WNL WNL  Lt rotation WNL  WNL    (Blank rows = not tested)     LE MMT:   MMT Right 01/17/2021 Left 01/17/2021 Bilat 02/17/20  Hip flexion 4/5 4/5 4/5  Hip extension     4/5  Hip abduction       Hip adduction       Hip internal rotation       Hip external rotation       Knee flexion 5/5 5/5 5/5  Knee extension 5/5 5/5 5/5  Ankle dorsiflexion 5/5 5/5 5/5  Ankle plantarflexion       Ankle inversion       Ankle eversion        (Blank rows = not tested)   LUMBAR SPECIAL TESTS:  None    FUNCTIONAL TESTS:  5 times sit to stand:  9.2 sec Date 01/22/21 6 MWT: 1017  feet (pain 8/10) Date: 01/19/21    6 MWT 1273 feet (pain 1/10) date 02/27/21   GAIT: Distance walked: 150 Assistive device utilized: None Level of assistance: Modified independence Comments: slow pace, small steps, Rt foot turned out (hip  ER), tight ant hips   [facility] Adult PT Treatment:                                                DATE: 03/06/21 Therapeutic Exercise: Pilates Reformer used for LE/core strength, postural strength, lumbopelvic disassociation and core control.  Exercises included: Footwork 2 red 1 blue Parallel on heels, arches and toes Heel raise x 10  Pilates V heels and toes  Heel raise x 10 in V  Single leg 2 red 1 blue x 15 each , Rt LE externally rotated Single knee to chest, windshield wiper legs for hip release  Wide hip ER with 4 springs   Bridging partial with max cues for initiating with post pelvic tilt x 10 with red band  Full articulating bridge with red band slow eccentric  x 6  Supine Arm work: 1 red 1 yellow Arcs, Circles  and triceps x 10   Feet in Straps 1 red 1 yellow  Arcs in parallel , Toes out and Toes in  Stretching to finish (adductor and ER) each side   [facility] Adult PT Treatment:                                                DATE: 03/01/21 Therapeutic Exercise: Supine knee to chest  Hip circles blue band x 10 each side  LTR with wide knees x 10  Sidelying book openings x 5  HIp abduction red loop at lower leg  x10 Sidekick no loop x 10  1/2 pigeon each side 1 min  Sit to stand no wgt Squat 15 lbs x 11  Sumo Squat  x 10  Standing roll back bar extension bilateral and then single arm facing sideways  Tandem stance with row with roll back bar x 10  Supine hip flexor  x2    PATIENT EDUCATION:  Education details: continue HEP Person educated: Patient Education method: Explanation Education comprehension: verbalized understanding, returned demonstration, and needs further education     HOME EXERCISE PROGRAM:  Access Code: [email or web address] URL: [email or web address]       ASSESSMENT:   CLINICAL IMPRESSION:  Patient able to perform Level 1 Pilates Reformer exercises without pain .  She had no stiffness after the session.  She has Rt sided hip tightness and  notable turnout that has been since she was a young girl.  Did not force the leg to parallel but encouraged stretch in internal rotation during the session.  Cont POC.   REHAB POTENTIAL: Excellent   CLINICAL DECISION MAKING: Stable/uncomplicated   EVALUATION COMPLEXITY: Low     GOALS: LONG TERM GOALS:    LTG Name Target Date Goal status  1 Pt will be I with HEP for hips, core  Baseline: 04/10/21 02/27/21 ongoing   2 FOTO score will improve to 53% or more to demo improved functional mobility  Baseline:38%  status: 51% 04/10/21 02/27/21 Partially met  3 Pt will increase Rt LE strength (hip) to 4+/5 in order to support spine in standing and walking Baseline:4/5 status 02/27/21 4/5 04/10/21 02/27/21 Ongoing  4 Pt will be able to stand for basic home tasks for about 30 min with min increase in Rt LE, back pain from baseline.  Baseline:   status 02/27/21: met 04/10/21 02/27/21 met  5 Pt will be able to cross Rt LE in sitting to don shoes without hip pain (ROM ) Baseline: status: can cross LE but still with discomfort 04/10/21 02/28/20 met  6 Pt will be able to return to light walking 20 min a few times per week without increasing back or hip pain  Baseline: status: walking on TM at home 10 minutes, no major increase in pain  04/10/21 02/15/21 met   7 Pt will report more confidence with steps in the community , basketball games due to increased LE strength.   04/10/21  new     PLAN: PT FREQUENCY: 1 x per week    PT DURATION: 4 weeks- 6 weeks (4 more appts )   PLANNED INTERVENTIONS: Therapeutic exercises, Therapeutic activity, Neuro Muscular re-education, Balance training, Gait training, Patient/Family education, Joint mobilization, Aquatic Therapy, Dry Needling, Electrical stimulation, Cryotherapy, Moist heat, and Manual therapy   PLAN FOR NEXT SESSION: Progress as tol 1 x per week.  stand as tolerated.  Steps, glute strength . Consider DN.   TM.  How were band ex.? . HIp ROM, lower abs/core    Raeford Razor, PT 03/06/21 11:20 AM Phone: [phone] Fax: [phone]

## 2021-06-15 ENCOUNTER — Other Ambulatory Visit: Payer: Self-pay | Admitting: Family Medicine

## 2021-06-15 DIAGNOSIS — Z13820 Encounter for screening for osteoporosis: Secondary | ICD-10-CM

## 2021-06-15 DIAGNOSIS — Z780 Asymptomatic menopausal state: Secondary | ICD-10-CM

## 2021-07-23 ENCOUNTER — Ambulatory Visit
Admission: RE | Admit: 2021-07-23 | Discharge: 2021-07-23 | Disposition: A | Discharge status: Home / Self Care | Payer: 59 | Source: Ambulatory Visit | Attending: Family Medicine | Admitting: Family Medicine

## 2021-07-23 DIAGNOSIS — Z13820 Encounter for screening for osteoporosis: Secondary | ICD-10-CM

## 2021-07-23 DIAGNOSIS — Z780 Asymptomatic menopausal state: Secondary | ICD-10-CM

## 2021-08-02 ENCOUNTER — Other Ambulatory Visit: Payer: Self-pay | Admitting: Family Medicine

## 2021-08-02 DIAGNOSIS — Z1231 Encounter for screening mammogram for malignant neoplasm of breast: Secondary | ICD-10-CM

## 2021-09-13 ENCOUNTER — Ambulatory Visit
Admission: RE | Admit: 2021-09-13 | Discharge: 2021-09-13 | Disposition: A | Discharge status: Home / Self Care | Payer: 59 | Source: Ambulatory Visit | Attending: Family Medicine | Admitting: Family Medicine

## 2021-09-13 DIAGNOSIS — Z1231 Encounter for screening mammogram for malignant neoplasm of breast: Secondary | ICD-10-CM

## 2022-08-28 ENCOUNTER — Other Ambulatory Visit: Payer: Self-pay | Admitting: Family Medicine

## 2022-08-28 DIAGNOSIS — Z1231 Encounter for screening mammogram for malignant neoplasm of breast: Secondary | ICD-10-CM

## 2022-09-19 ENCOUNTER — Ambulatory Visit: Payer: 59

## 2022-10-01 ENCOUNTER — Ambulatory Visit
Admission: RE | Admit: 2022-10-01 | Discharge: 2022-10-01 | Disposition: A | Discharge status: Home / Self Care | Payer: 59 | Source: Ambulatory Visit | Attending: Family Medicine | Admitting: Family Medicine

## 2022-10-01 DIAGNOSIS — Z1231 Encounter for screening mammogram for malignant neoplasm of breast: Secondary | ICD-10-CM

## 2023-03-03 ENCOUNTER — Other Ambulatory Visit (INDEPENDENT_AMBULATORY_CARE_PROVIDER_SITE_OTHER): Payer: Self-pay

## 2023-03-03 ENCOUNTER — Ambulatory Visit (INDEPENDENT_AMBULATORY_CARE_PROVIDER_SITE_OTHER): Payer: 59 | Admitting: Orthopaedic Surgery

## 2023-03-03 DIAGNOSIS — M1612 Unilateral primary osteoarthritis, left hip: Secondary | ICD-10-CM | POA: Insufficient documentation

## 2023-03-03 DIAGNOSIS — M25552 Pain in left hip: Secondary | ICD-10-CM

## 2023-03-03 DIAGNOSIS — M25551 Pain in right hip: Secondary | ICD-10-CM | POA: Diagnosis not present

## 2023-03-03 DIAGNOSIS — M1611 Unilateral primary osteoarthritis, right hip: Secondary | ICD-10-CM | POA: Insufficient documentation

## 2023-03-03 NOTE — Progress Notes (Signed)
The patient is a 73 year old female that I am seeing for the first time has been dealing with bilateral hip pain for 2 years now.  The left is been worse than the right.  She has been worked up extensively for her spine and they felt it was a spine issue originally but it ended up being her hips.  I did see x-rays of her pelvis and right hip from a few years ago that showed arthritic changes in the right hip.  At this point though her pain is 10 out of 10 and is daily with both hips.  She takes Tylenol for pain.  It is detrimentally affecting her mobility, her quality of life and her actives daily living.  She does have to walk with a rolling walker.  She is not a diabetic.  She has a hard time with her ADLs as a relates to her bilateral hip pain.  On exam both hips have significant limitations with internal and external rotation with severe pain in the groin with rotation.  Of note I was able to review all of her notes and past medical history as well as medications within epic.  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  We had a long and thorough discussion about hip replacement surgery.  I showed her hip replacement model and gave her hand about hip replacement surgery.  Her husband is with her today as well and they had appropriate questions that were asked and answered.  We discussed the risks and benefits of surgery and what to expect from an intraoperative and postoperative standpoint.  All questions and concerns again were addressed and answered and we will work on getting her scheduled soon for a left total hip arthroplasty which is normal painful hip for now.  We would then work closely on getting her set up for a right total hip arthroplasty once she has recovered from her left hip.

## 2023-04-11 NOTE — Progress Notes (Addendum)
 COVID Vaccine Completed: yes  Date of COVID positive in last 90 days: no  PCP - Jorge Ny, FNP Cardiologist - n/a  Chest x-ray - n/a EKG - 04/15/23 Epic/chart Stress Test - n/a ECHO - n/a Cardiac Cath - n/a Pacemaker/ICD device last checked: n/a Spinal Cord Stimulator: n/a  Bowel Prep - no  Sleep Study - n/a CPAP -   Fasting Blood Sugar - n/a Checks Blood Sugar _____ times a day  Last dose of GLP1 agonist-  N/A GLP1 instructions:  Hold 7 days before surgery    Last dose of SGLT-2 inhibitors-  N/A SGLT-2 instructions:  Hold 3 days before surgery    Blood Thinner Instructions:  Last dose:  n/a  Time: Aspirin Instructions: Last Dose:  Activity level: Can perform activities of daily living without stopping and without symptoms of chest pain or shortness of breath. Avoids stairs due to hip pain. Ambulates with Rolator   Anesthesia review:   Patient denies shortness of breath, fever, cough and chest pain at PAT appointment  Patient verbalized understanding of instructions that were given to them at the PAT appointment. Patient was also instructed that they will need to review over the PAT instructions again at home before surgery.

## 2023-04-11 NOTE — Patient Instructions (Addendum)
 SURGICAL WAITING ROOM VISITATION  Patients having surgery or a procedure may have no more than 2 support people in the waiting area - these visitors may rotate.    Children under the age of 65 must have an adult with them who is not the patient.  Due to an increase in RSV and influenza rates and associated hospitalizations, children ages 73 and under may not visit patients in [facility] hospitals.  Visitors with respiratory illnesses are discouraged from visiting and should remain at home.  If the patient needs to stay at the hospital during part of their recovery, the visitor guidelines for inpatient rooms apply. Pre-op nurse will coordinate an appropriate time for 1 support person to accompany patient in pre-op.  This support person may not rotate.    Please refer to the [facility] website for the visitor guidelines for Inpatients (after your surgery is over and you are in a regular room).    Your procedure is scheduled on: 04/18/23   Report to [facility] Main Entrance    Report to admitting at 7:30 AM   Call this number if you have problems the morning of surgery [phone]   Do not eat food :After Midnight.   After Midnight you may have the following liquids until 7:00 AM DAY OF SURGERY  Water Non-Citrus Juices (without pulp, NO RED-Apple, White grape, White cranberry) Black Coffee (NO MILK/CREAM OR CREAMERS, sugar ok)  Clear Tea (NO MILK/CREAM OR CREAMERS, sugar ok) regular and decaf                             Plain Jell-O (NO RED)                                           Fruit ices (not with fruit pulp, NO RED)                                     Popsicles (NO RED)                                                               Sports drinks like Gatorade (NO RED)                 The day of surgery:  Drink ONE (1) Pre-Surgery Clear Ensure at 7:00 AM the morning of surgery. Drink in one sitting. Do not sip.  This drink was given to you during your hospital   pre-op appointment visit. Nothing else to drink after completing the  Pre-Surgery Clear Ensure          If you have questions, please contact your surgeon's office.   FOLLOW BOWEL PREP AND ANY ADDITIONAL PRE OP INSTRUCTIONS YOU RECEIVED FROM YOUR SURGEON'S OFFICE!!!     Oral Hygiene is also important to reduce your risk of infection.                                    Remember -  BRUSH YOUR TEETH THE MORNING OF SURGERY WITH YOUR REGULAR TOOTHPASTE  DENTURES WILL BE REMOVED PRIOR TO SURGERY PLEASE DO NOT APPLY "Poly grip" OR ADHESIVES!!!   Stop all vitamins and herbal supplements 7 days before surgery.   Take these medicines the morning of surgery with A SIP OF WATER: Tylenol, Amlodipine                              You may not have any metal on your body including hair pins, jewelry, and body piercing             Do not wear make-up, lotions, powders, perfumes, or deodorant  Do not wear nail polish including gel and S&S, artificial/acrylic nails, or any other type of covering on natural nails including finger and toenails. If you have artificial nails, gel coating, etc. that needs to be removed by a nail salon please have this removed prior to surgery or surgery may need to be canceled/ delayed if the surgeon/ anesthesia feels like they are unable to be safely monitored.   Do not shave  48 hours prior to surgery.    Do not bring valuables to the hospital. [facility] IS NOT             RESPONSIBLE   FOR VALUABLES.   Contacts, glasses, dentures or bridgework may not be worn into surgery.   Bring small overnight bag day of surgery.   DO NOT BRING YOUR HOME MEDICATIONS TO THE HOSPITAL. PHARMACY WILL DISPENSE MEDICATIONS LISTED ON YOUR MEDICATION LIST TO YOU DURING YOUR ADMISSION IN THE HOSPITAL!   Special Instructions: Bring a copy of your healthcare power of attorney and living will documents the day of surgery if you haven't scanned them before.              Please read over  the following fact sheets you were given: IF YOU HAVE QUESTIONS ABOUT YOUR PRE-OP INSTRUCTIONS PLEASE CALL [name]    If you received a COVID test during your pre-op visit  it is requested that you wear a mask when out in public, stay away from anyone that may not be feeling well and notify your surgeon if you develop symptoms. If you test positive for Covid or have been in contact with anyone that has tested positive in the last 10 days please notify you surgeon.      Pre-operative 5 CHG Bath Instructions   You can play a key role in reducing the risk of infection after surgery. Your skin needs to be as free of germs as possible. You can reduce the number of germs on your skin by washing with CHG (chlorhexidine gluconate) soap before surgery. CHG is an antiseptic soap that kills germs and continues to kill germs even after washing.   DO NOT use if you have an allergy to chlorhexidine/CHG or antibacterial soaps. If your skin becomes reddened or irritated, stop using the CHG and notify one of our RNs at [phone].   Please shower with the CHG soap starting 4 days before surgery using the following schedule:     Please keep in mind the following:  DO NOT shave, including legs and underarms, starting the day of your first shower.   You may shave your face at any point before/day of surgery.  Place clean sheets on your bed the day you start using CHG soap. Use a clean washcloth (not used since being washed)  for each shower. DO NOT sleep with pets once you start using the CHG.   CHG Shower Instructions:  If you choose to wash your hair and private area, wash first with your normal shampoo/soap.  After you use shampoo/soap, rinse your hair and body thoroughly to remove shampoo/soap residue.  Turn the water OFF and apply about 3 tablespoons (45 ml) of CHG soap to a CLEAN washcloth.  Apply CHG soap ONLY FROM YOUR NECK DOWN TO YOUR TOES (washing for 3-5 minutes)  DO NOT use CHG  soap on face, private areas, open wounds, or sores.  Pay special attention to the area where your surgery is being performed.  If you are having back surgery, having someone wash your back for you may be helpful. Wait 2 minutes after CHG soap is applied, then you may rinse off the CHG soap.  Pat dry with a clean towel  Put on clean clothes/pajamas   If you choose to wear lotion, please use ONLY the CHG-compatible lotions on the back of this paper.     Additional instructions for the day of surgery: DO NOT APPLY any lotions, deodorants, cologne, or perfumes.   Put on clean/comfortable clothes.  Brush your teeth.  Ask your nurse before applying any prescription medications to the skin.      CHG Compatible Lotions   Aveeno Moisturizing lotion  Cetaphil Moisturizing Cream  Cetaphil Moisturizing Lotion  Clairol Herbal Essence Moisturizing Lotion, Dry Skin  Clairol Herbal Essence Moisturizing Lotion, Extra Dry Skin  Clairol Herbal Essence Moisturizing Lotion, Normal Skin  Curel Age Defying Therapeutic Moisturizing Lotion with Alpha Hydroxy  Curel Extreme Care Body Lotion  Curel Soothing Hands Moisturizing Hand Lotion  Curel Therapeutic Moisturizing Cream, Fragrance-Free  Curel Therapeutic Moisturizing Lotion, Fragrance-Free  Curel Therapeutic Moisturizing Lotion, Original Formula  Eucerin Daily Replenishing Lotion  Eucerin Dry Skin Therapy Plus Alpha Hydroxy Crme  Eucerin Dry Skin Therapy Plus Alpha Hydroxy Lotion  Eucerin Original Crme  Eucerin Original Lotion  Eucerin Plus Crme Eucerin Plus Lotion  Eucerin TriLipid Replenishing Lotion  Keri Anti-Bacterial Hand Lotion  Keri Deep Conditioning Original Lotion Dry Skin Formula Softly Scented  Keri Deep Conditioning Original Lotion, Fragrance Free Sensitive Skin Formula  Keri Lotion Fast Absorbing Fragrance Free Sensitive Skin Formula  Keri Lotion Fast Absorbing Softly Scented Dry Skin Formula  Keri Original Lotion  Keri Skin  Renewal Lotion Keri Silky Smooth Lotion  Keri Silky Smooth Sensitive Skin Lotion  Nivea Body Creamy Conditioning Oil  Nivea Body Extra Enriched Lotion  Nivea Body Original Lotion  Nivea Body Sheer Moisturizing Lotion Nivea Crme  Nivea Skin Firming Lotion  NutraDerm 30 Skin Lotion  NutraDerm Skin Lotion  NutraDerm Therapeutic Skin Cream  NutraDerm Therapeutic Skin Lotion  ProShield Protective Hand Cream  Provon moisturizing lotion   Incentive Spirometer  An incentive spirometer is a tool that can help keep your lungs clear and active. This tool measures how well you are filling your lungs with each breath. Taking long deep breaths may help reverse or decrease the chance of developing breathing (pulmonary) problems (especially infection) following: A long period of time when you are unable to move or be active. BEFORE THE PROCEDURE  If the spirometer includes an indicator to show your best effort, your nurse or respiratory therapist will set it to a desired goal. If possible, sit up straight or lean slightly forward. Try not to slouch. Hold the incentive spirometer in an upright position. INSTRUCTIONS FOR USE  Sit on the edge  of your bed if possible, or sit up as far as you can in bed or on a chair. Hold the incentive spirometer in an upright position. Breathe out normally. Place the mouthpiece in your mouth and seal your lips tightly around it. Breathe in slowly and as deeply as possible, raising the piston or the ball toward the top of the column. Hold your breath for 3-5 seconds or for as long as possible. Allow the piston or ball to fall to the bottom of the column. Remove the mouthpiece from your mouth and breathe out normally. Rest for a few seconds and repeat Steps 1 through 7 at least 10 times every 1-2 hours when you are awake. Take your time and take a few normal breaths between deep breaths. The spirometer may include an indicator to show your best effort. Use the indicator  as a goal to work toward during each repetition. After each set of 10 deep breaths, practice coughing to be sure your lungs are clear. If you have an incision (the cut made at the time of surgery), support your incision when coughing by placing a pillow or rolled up towels firmly against it. Once you are able to get out of bed, walk around indoors and cough well. You may stop using the incentive spirometer when instructed by your caregiver.  RISKS AND COMPLICATIONS Take your time so you do not get dizzy or light-headed. If you are in pain, you may need to take or ask for pain medication before doing incentive spirometry. It is harder to take a deep breath if you are having pain. AFTER USE Rest and breathe slowly and easily. It can be helpful to keep track of a log of your progress. Your caregiver can provide you with a simple table to help with this. If you are using the spirometer at home, follow these instructions: SEEK MEDICAL CARE IF:  You are having difficultly using the spirometer. You have trouble using the spirometer as often as instructed. Your pain medication is not giving enough relief while using the spirometer. You develop fever of 100.5 F (38.1 C) or higher. SEEK IMMEDIATE MEDICAL CARE IF:  You cough up bloody sputum that had not been present before. You develop fever of 102 F (38.9 C) or greater. You develop worsening pain at or near the incision site. MAKE SURE YOU:  Understand these instructions. Will watch your condition. Will get help right away if you are not doing well or get worse. Document Released: 05/13/2006 Document Revised: 03/25/2011 Document Reviewed: 07/14/2006 [employer] Patient Information 2014 [place].   ________________________________________________________________________ View Pre-Surgery Education Videos:  [email or web address]    WHAT IS A BLOOD TRANSFUSION? Blood Transfusion  Information  A transfusion is the replacement of blood or some of its parts. Blood is made up of multiple cells which provide different functions. Red blood cells carry oxygen and are used for blood loss replacement. White blood cells fight against infection. Platelets control bleeding. Plasma helps clot blood. Other blood products are available for specialized needs, such as hemophilia or other clotting disorders. BEFORE THE TRANSFUSION  Who gives blood for transfusions?  Healthy volunteers who are fully evaluated to make sure their blood is safe. This is blood bank blood. Transfusion therapy is the safest it has ever been in the practice of medicine. Before blood is taken from a donor, a complete history is taken to make sure that person has no history of diseases nor engages in risky social behavior (examples are intravenous drug  use or sexual activity with multiple partners). The donor's travel history is screened to minimize risk of transmitting infections, such as malaria. The donated blood is tested for signs of infectious diseases, such as HIV and hepatitis. The blood is then tested to be sure it is compatible with you in order to minimize the chance of a transfusion reaction. If you or a relative donates blood, this is often done in anticipation of surgery and is not appropriate for emergency situations. It takes many days to process the donated blood. RISKS AND COMPLICATIONS Although transfusion therapy is very safe and saves many lives, the main dangers of transfusion include:  Getting an infectious disease. Developing a transfusion reaction. This is an allergic reaction to something in the blood you were given. Every precaution is taken to prevent this. The decision to have a blood transfusion has been considered carefully by your caregiver before blood is given. Blood is not given unless the benefits outweigh the risks. AFTER THE TRANSFUSION Right after receiving a blood transfusion,  you will usually feel much better and more energetic. This is especially true if your red blood cells have gotten low (anemic). The transfusion raises the level of the red blood cells which carry oxygen, and this usually causes an energy increase. The nurse administering the transfusion will monitor you carefully for complications. HOME CARE INSTRUCTIONS  No special instructions are needed after a transfusion. You may find your energy is better. Speak with your caregiver about any limitations on activity for underlying diseases you may have. SEEK MEDICAL CARE IF:  Your condition is not improving after your transfusion. You develop redness or irritation at the intravenous (IV) site. SEEK IMMEDIATE MEDICAL CARE IF:  Any of the following symptoms occur over the next 12 hours: Shaking chills. You have a temperature by mouth above 102 F (38.9 C), not controlled by medicine. Chest, back, or muscle pain. People around you feel you are not acting correctly or are confused. Shortness of breath or difficulty breathing. Dizziness and fainting. You get a rash or develop hives. You have a decrease in urine output. Your urine turns a dark color or changes to pink, red, or brown. Any of the following symptoms occur over the next 10 days: You have a temperature by mouth above 102 F (38.9 C), not controlled by medicine. Shortness of breath. Weakness after normal activity. The white part of the eye turns yellow (jaundice). You have a decrease in the amount of urine or are urinating less often. Your urine turns a dark color or changes to pink, red, or brown. Document Released: 12/29/1999 Document Revised: 03/25/2011 Document Reviewed: 08/17/2007 [facility] Patient Information 2014 [place], [place].  _______________________________________________________________________

## 2023-04-15 ENCOUNTER — Encounter (HOSPITAL_COMMUNITY)
Admission: RE | Admit: 2023-04-15 | Discharge: 2023-04-15 | Disposition: A | Discharge status: Home / Self Care | Source: Ambulatory Visit | Attending: Orthopaedic Surgery | Admitting: Orthopaedic Surgery

## 2023-04-15 ENCOUNTER — Other Ambulatory Visit: Payer: Self-pay

## 2023-04-15 ENCOUNTER — Encounter (HOSPITAL_COMMUNITY): Payer: Self-pay

## 2023-04-15 VITALS — BP 131/93 | HR 78 | Temp 97.8°F | Resp 14 | Ht 63.0 in | Wt 184.0 lb

## 2023-04-15 DIAGNOSIS — M1612 Unilateral primary osteoarthritis, left hip: Secondary | ICD-10-CM | POA: Insufficient documentation

## 2023-04-15 DIAGNOSIS — Z01818 Encounter for other preprocedural examination: Secondary | ICD-10-CM | POA: Diagnosis present

## 2023-04-15 DIAGNOSIS — I10 Essential (primary) hypertension: Secondary | ICD-10-CM | POA: Diagnosis not present

## 2023-04-15 HISTORY — DX: Unspecified osteoarthritis, unspecified site: M19.90

## 2023-04-15 LAB — BASIC METABOLIC PANEL WITH GFR
Anion gap: 13 (ref 5–15)
BUN: 16 mg/dL (ref 8–23)
CO2: 25 mmol/L (ref 22–32)
Calcium: 9.9 mg/dL (ref 8.9–10.3)
Chloride: 98 mmol/L (ref 98–111)
Creatinine, Ser: 0.64 mg/dL (ref 0.44–1.00)
GFR, Estimated: >60 mL/min (ref >60)
Glucose, Bld: 100 mg/dL — ABNORMAL HIGH (ref 70–99)
Potassium: 3.7 mmol/L (ref 3.5–5.1)
Sodium: 136 mmol/L (ref 135–145)

## 2023-04-15 LAB — CBC
HCT: 47.9 % — ABNORMAL HIGH (ref 36.0–46.0)
Hemoglobin: 15.8 g/dL — ABNORMAL HIGH (ref 12.0–15.0)
MCH: 30.4 pg (ref 26.0–34.0)
MCHC: 33 g/dL (ref 30.0–36.0)
MCV: 92.3 fL (ref 80.0–100.0)
Platelets: 437 10*3/uL — ABNORMAL HIGH (ref 150–400)
RBC: 5.19 MIL/uL — ABNORMAL HIGH (ref 3.87–5.11)
RDW: 12.7 % (ref 11.5–15.5)
WBC: 10.4 10*3/uL (ref 4.0–10.5)
nRBC: 0 % (ref 0.0–0.2)

## 2023-04-15 LAB — SURGICAL PCR SCREEN
MRSA, PCR: NEGATIVE
Staphylococcus aureus: NEGATIVE

## 2023-04-17 NOTE — H&P (Signed)
 TOTAL HIP ADMISSION H&P  Patient is admitted for left total hip arthroplasty.  Subjective:  Chief Complaint: left hip pain  HPI: Colleen Wiley, 73 y.o. female, has a history of pain and functional disability in the left hip(s) due to arthritis and patient has failed non-surgical conservative treatments for greater than 12 weeks to include NSAID's and/or analgesics, use of assistive devices, and activity modification.  Onset of symptoms was gradual starting 2 years ago with gradually worsening course since that time.The patient noted no past surgery on the left hip(s).  Patient currently rates pain in the left hip at 10 out of 10 with activity. Patient has night pain, worsening of pain with activity and weight bearing, trendelenberg gait, pain that interfers with activities of daily living, and pain with passive range of motion. Patient has evidence of subchondral cysts, subchondral sclerosis, periarticular osteophytes, and joint space narrowing by imaging studies. This condition presents safety issues increasing the risk of falls.  There is no current active infection.  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   Current Outpatient Medications  Medication Sig Dispense Refill Last Dose/Taking   acetaminophen (TYLENOL) 650 MG CR tablet Take 1,300 mg by mouth every 8 (eight) hours as needed for pain.   Taking As Needed   amLODipine (NORVASC) 5 MG tablet Take 5 mg by mouth daily.   Taking   hydrochlorothiazide (HYDRODIURIL) 25 MG tablet Take 25 mg by mouth daily.   Taking    potassium chloride (KLOR-CON) 10 MEQ tablet Take 10 mEq by mouth daily.   Taking   No Known Allergies  Social History   Tobacco Use   Smoking status: Never   Smokeless tobacco: Never  Substance Use Topics   Alcohol use: Not Currently    Comment: social drinker     Family History  Problem Relation Age of Onset   Breast cancer Mother 65   Cancer Mother        neuroendocrine tumor of pancrease    Pancreatic cancer Maternal Uncle    Pancreatic cancer Maternal Uncle    Breast cancer Cousin 69   Cancer Cousin        lung cancer   Ovarian cancer Other    Cancer Other        unknown type of cancer      Review of Systems  Objective:  Physical Exam Vitals reviewed.  Constitutional:      Appearance: Normal appearance.  HENT:     Head: Normocephalic and atraumatic.  Eyes:     Extraocular Movements: Extraocular movements intact.     Pupils: Pupils are equal, round, and reactive to light.  Cardiovascular:     Rate and Rhythm: Normal rate and regular rhythm.  Pulmonary:     Effort: Pulmonary effort is normal.     Breath sounds: Normal breath sounds.  Abdominal:     Palpations: Abdomen is soft.  Musculoskeletal:     Cervical back: Normal range of motion and neck supple.     Left hip: Tenderness and bony tenderness present.  Decreased range of motion. Decreased strength.  Neurological:     Mental Status: She is alert and oriented to person, place, and time.  Psychiatric:        Behavior: Behavior normal.     Vital signs in last 24 hours:    Labs:   Estimated body mass index is 32.59 kg/m as calculated from the following:   Height as of 04/15/23: 5\' 3"  (1.6 m).   Weight as of 04/15/23: 83.5 kg.   Imaging Review Plain radiographs demonstrate severe degenerative joint disease of the left hip(s). The bone quality appears to be good for age and reported activity level.      Assessment/Plan:  End stage arthritis, left hip(s)  The patient history, physical  examination, clinical judgement of the provider and imaging studies are consistent with end stage degenerative joint disease of the left hip(s) and total hip arthroplasty is deemed medically necessary. The treatment options including medical management, injection therapy, arthroscopy and arthroplasty were discussed at length. The risks and benefits of total hip arthroplasty were presented and reviewed. The risks due to aseptic loosening, infection, stiffness, dislocation/subluxation,  thromboembolic complications and other imponderables were discussed.  The patient acknowledged the explanation, agreed to proceed with the plan and consent was signed. Patient is being admitted for inpatient treatment for surgery, pain control, PT, OT, prophylactic antibiotics, VTE prophylaxis, progressive ambulation and ADL's and discharge planning.The patient is planning to be discharged home with home health services

## 2023-04-18 ENCOUNTER — Ambulatory Visit (HOSPITAL_COMMUNITY): Admitting: Anesthesiology

## 2023-04-18 ENCOUNTER — Other Ambulatory Visit: Payer: Self-pay

## 2023-04-18 ENCOUNTER — Observation Stay (HOSPITAL_COMMUNITY)
Admission: RE | Admit: 2023-04-18 | Discharge: 2023-04-19 | Disposition: A | Discharge status: Home / Self Care | Attending: Orthopaedic Surgery | Admitting: Orthopaedic Surgery

## 2023-04-18 ENCOUNTER — Observation Stay (HOSPITAL_COMMUNITY)

## 2023-04-18 ENCOUNTER — Ambulatory Visit (HOSPITAL_COMMUNITY)

## 2023-04-18 ENCOUNTER — Encounter (HOSPITAL_COMMUNITY)
Admission: RE | Disposition: A | Discharge status: Home / Self Care | Payer: Self-pay | Source: Home / Self Care | Attending: Orthopaedic Surgery

## 2023-04-18 ENCOUNTER — Encounter (HOSPITAL_COMMUNITY): Payer: Self-pay | Admitting: Orthopaedic Surgery

## 2023-04-18 DIAGNOSIS — Z96642 Presence of left artificial hip joint: Secondary | ICD-10-CM

## 2023-04-18 DIAGNOSIS — I10 Essential (primary) hypertension: Secondary | ICD-10-CM | POA: Insufficient documentation

## 2023-04-18 DIAGNOSIS — M1612 Unilateral primary osteoarthritis, left hip: Principal | ICD-10-CM | POA: Diagnosis present

## 2023-04-18 DIAGNOSIS — Z79899 Other long term (current) drug therapy: Secondary | ICD-10-CM | POA: Diagnosis not present

## 2023-04-18 LAB — ABO/RH: ABO/RH(D): O POS

## 2023-04-18 LAB — TYPE AND SCREEN
ABO/RH(D): O POS
Antibody Screen: NEGATIVE

## 2023-04-18 SURGERY — ARTHROPLASTY, HIP, TOTAL, ANTERIOR APPROACH
Anesthesia: Spinal | Site: Hip | Laterality: Left

## 2023-04-18 MED ORDER — ONDANSETRON HCL 4 MG/2ML IJ SOLN: 4.0000 mg | Freq: Four times a day (QID) | INTRAMUSCULAR | Status: DC | PRN

## 2023-04-18 MED ORDER — DEXAMETHASONE SODIUM PHOSPHATE 10 MG/ML IJ SOLN
INTRAMUSCULAR | Status: AC
  Filled 2023-04-18: qty 1

## 2023-04-18 MED ORDER — LIDOCAINE HCL (CARDIAC) PF 100 MG/5ML IV SOSY
PREFILLED_SYRINGE | INTRAVENOUS | Status: DC | PRN
  Administered 2023-04-18: 100 mg via INTRATRACHEAL

## 2023-04-18 MED ORDER — MENTHOL 3 MG MT LOZG: 1.0000 | LOZENGE | OROMUCOSAL | Status: DC | PRN

## 2023-04-18 MED ORDER — ACETAMINOPHEN 10 MG/ML IV SOLN: 1000.0000 mg | Freq: Once | INTRAVENOUS | Status: DC | PRN

## 2023-04-18 MED ORDER — 0.9 % SODIUM CHLORIDE (POUR BTL) OPTIME
TOPICAL | Status: DC | PRN
  Administered 2023-04-18: 1000 mL

## 2023-04-18 MED ORDER — LACTATED RINGERS IV SOLN: INTRAVENOUS | Status: DC

## 2023-04-18 MED ORDER — PHENYLEPHRINE HCL-NACL 20-0.9 MG/250ML-% IV SOLN
INTRAVENOUS | Status: DC | PRN
Start: 2023-04-18 — End: 2023-04-18
  Administered 2023-04-18: 50 ug/min via INTRAVENOUS

## 2023-04-18 MED ORDER — HYDROCHLOROTHIAZIDE 25 MG PO TABS
25.0000 mg | ORAL_TABLET | Freq: Every day | ORAL | Status: DC
  Administered 2023-04-19: 25 mg via ORAL
  Filled 2023-04-18: qty 1

## 2023-04-18 MED ORDER — SODIUM CHLORIDE 0.9 % IR SOLN
Status: DC | PRN
  Administered 2023-04-18: 1000 mL

## 2023-04-18 MED ORDER — HYDROMORPHONE HCL 1 MG/ML IJ SOLN: 0.2500 mg | INTRAMUSCULAR | Status: DC | PRN

## 2023-04-18 MED ORDER — CEFAZOLIN SODIUM-DEXTROSE 2-4 GM/100ML-% IV SOLN
2.0000 g | INTRAVENOUS | Status: AC
  Administered 2023-04-18: 2 g via INTRAVENOUS
  Filled 2023-04-18: qty 100

## 2023-04-18 MED ORDER — SODIUM CHLORIDE 0.9 % IV SOLN: INTRAVENOUS | Status: DC

## 2023-04-18 MED ORDER — GLYCOPYRROLATE 0.2 MG/ML IJ SOLN
INTRAMUSCULAR | Status: AC
  Filled 2023-04-18: qty 1

## 2023-04-18 MED ORDER — CHLORHEXIDINE GLUCONATE 0.12 % MT SOLN
15.0000 mL | Freq: Once | OROMUCOSAL | Status: AC
  Administered 2023-04-18: 15 mL via OROMUCOSAL

## 2023-04-18 MED ORDER — PROPOFOL 10 MG/ML IV BOLUS
INTRAVENOUS | Status: DC | PRN
  Administered 2023-04-18: 60 mg via INTRAVENOUS

## 2023-04-18 MED ORDER — DIPHENHYDRAMINE HCL 12.5 MG/5ML PO ELIX: 12.5000 mg | ORAL_SOLUTION | ORAL | Status: DC | PRN

## 2023-04-18 MED ORDER — AMLODIPINE BESYLATE 5 MG PO TABS
5.0000 mg | ORAL_TABLET | Freq: Every day | ORAL | Status: DC
  Administered 2023-04-19: 5 mg via ORAL
  Filled 2023-04-18: qty 1

## 2023-04-18 MED ORDER — TRANEXAMIC ACID-NACL 1000-0.7 MG/100ML-% IV SOLN
1000.0000 mg | INTRAVENOUS | Status: AC
Start: 2023-04-18 — End: 2023-04-18
  Administered 2023-04-18: 1000 mg via INTRAVENOUS
  Filled 2023-04-18: qty 100

## 2023-04-18 MED ORDER — DOCUSATE SODIUM 100 MG PO CAPS
100.0000 mg | ORAL_CAPSULE | Freq: Two times a day (BID) | ORAL | Status: DC
  Administered 2023-04-18 – 2023-04-19 (×2): 100 mg via ORAL
  Filled 2023-04-18 (×2): qty 1

## 2023-04-18 MED ORDER — OXYCODONE HCL 5 MG PO TABS: 10.0000 mg | ORAL_TABLET | ORAL | Status: DC | PRN

## 2023-04-18 MED ORDER — PANTOPRAZOLE SODIUM 40 MG PO TBEC: 40.0000 mg | DELAYED_RELEASE_TABLET | Freq: Every day | ORAL | Status: DC

## 2023-04-18 MED ORDER — PROPOFOL 500 MG/50ML IV EMUL
INTRAVENOUS | Status: DC | PRN
  Administered 2023-04-18: 120 ug/kg/min via INTRAVENOUS

## 2023-04-18 MED ORDER — ONDANSETRON HCL 4 MG PO TABS: 4.0000 mg | ORAL_TABLET | Freq: Four times a day (QID) | ORAL | Status: DC | PRN

## 2023-04-18 MED ORDER — POTASSIUM CHLORIDE ER 10 MEQ PO TBCR
10.0000 meq | EXTENDED_RELEASE_TABLET | Freq: Every day | ORAL | Status: DC
  Administered 2023-04-19: 10 meq via ORAL
  Filled 2023-04-18 (×2): qty 1

## 2023-04-18 MED ORDER — MEPERIDINE HCL 50 MG/ML IJ SOLN: 6.2500 mg | INTRAMUSCULAR | Status: DC | PRN

## 2023-04-18 MED ORDER — ACETAMINOPHEN 325 MG PO TABS: 325.0000 mg | ORAL_TABLET | Freq: Four times a day (QID) | ORAL | Status: DC | PRN

## 2023-04-18 MED ORDER — GLYCOPYRROLATE 0.2 MG/ML IJ SOLN
INTRAMUSCULAR | Status: DC | PRN
  Administered 2023-04-18: .2 mg via INTRAVENOUS

## 2023-04-18 MED ORDER — OXYCODONE HCL 5 MG PO TABS
5.0000 mg | ORAL_TABLET | ORAL | Status: DC | PRN
  Administered 2023-04-18 – 2023-04-19 (×3): 10 mg via ORAL
  Filled 2023-04-18: qty 1
  Filled 2023-04-18: qty 2
  Filled 2023-04-18: qty 1
  Filled 2023-04-18: qty 2

## 2023-04-18 MED ORDER — DEXAMETHASONE SODIUM PHOSPHATE 10 MG/ML IJ SOLN
INTRAMUSCULAR | Status: DC | PRN
  Administered 2023-04-18: 10 mg via INTRAVENOUS

## 2023-04-18 MED ORDER — DROPERIDOL 2.5 MG/ML IJ SOLN: 0.6250 mg | Freq: Once | INTRAMUSCULAR | Status: DC | PRN

## 2023-04-18 MED ORDER — ACETAMINOPHEN 500 MG PO TABS: 1000.0000 mg | ORAL_TABLET | Freq: Once | ORAL | Status: DC

## 2023-04-18 MED ORDER — METHOCARBAMOL 500 MG PO TABS
500.0000 mg | ORAL_TABLET | Freq: Four times a day (QID) | ORAL | Status: DC | PRN
  Administered 2023-04-18: 500 mg via ORAL
  Filled 2023-04-18: qty 1

## 2023-04-18 MED ORDER — PHENOL 1.4 % MT LIQD: 1.0000 | OROMUCOSAL | Status: DC | PRN

## 2023-04-18 MED ORDER — HYDROMORPHONE HCL 1 MG/ML IJ SOLN: 0.5000 mg | INTRAMUSCULAR | Status: DC | PRN

## 2023-04-18 MED ORDER — CEFAZOLIN SODIUM-DEXTROSE 2-4 GM/100ML-% IV SOLN
2.0000 g | Freq: Four times a day (QID) | INTRAVENOUS | Status: AC
  Administered 2023-04-18 (×2): 2 g via INTRAVENOUS
  Filled 2023-04-18 (×2): qty 100

## 2023-04-18 MED ORDER — ALUM & MAG HYDROXIDE-SIMETH 200-200-20 MG/5ML PO SUSP: 30.0000 mL | ORAL | Status: DC | PRN

## 2023-04-18 MED ORDER — METOCLOPRAMIDE HCL 5 MG PO TABS: 5.0000 mg | ORAL_TABLET | Freq: Three times a day (TID) | ORAL | Status: DC | PRN

## 2023-04-18 MED ORDER — ONDANSETRON HCL 4 MG/2ML IJ SOLN
INTRAMUSCULAR | Status: DC | PRN
  Administered 2023-04-18: 4 mg via INTRAVENOUS

## 2023-04-18 MED ORDER — STERILE WATER FOR IRRIGATION IR SOLN
Status: DC | PRN
  Administered 2023-04-18: 1000 mL

## 2023-04-18 MED ORDER — POVIDONE-IODINE 10 % EX SWAB: 2.0000 | Freq: Once | CUTANEOUS | Status: DC

## 2023-04-18 MED ORDER — ASPIRIN 81 MG PO CHEW
81.0000 mg | CHEWABLE_TABLET | Freq: Two times a day (BID) | ORAL | Status: DC
  Administered 2023-04-18 – 2023-04-19 (×2): 81 mg via ORAL
  Filled 2023-04-18 (×2): qty 1

## 2023-04-18 MED ORDER — METHOCARBAMOL 1000 MG/10ML IJ SOLN: 500.0000 mg | Freq: Four times a day (QID) | INTRAMUSCULAR | Status: DC | PRN

## 2023-04-18 MED ORDER — ORAL CARE MOUTH RINSE: 15.0000 mL | Freq: Once | OROMUCOSAL | Status: AC

## 2023-04-18 MED ORDER — ONDANSETRON HCL 4 MG/2ML IJ SOLN
INTRAMUSCULAR | Status: AC
  Filled 2023-04-18: qty 2

## 2023-04-18 MED ORDER — BUPIVACAINE IN DEXTROSE 0.75-8.25 % IT SOLN
INTRATHECAL | Status: DC | PRN
  Administered 2023-04-18: 2 mL via INTRATHECAL

## 2023-04-18 MED ORDER — METOCLOPRAMIDE HCL 5 MG/ML IJ SOLN: 5.0000 mg | Freq: Three times a day (TID) | INTRAMUSCULAR | Status: DC | PRN

## 2023-04-18 SURGICAL SUPPLY — 37 items
ACETAB CUP W/GRIPTION 54 (Plate) ×1 IMPLANT
BAG COUNTER SPONGE SURGICOUNT (BAG) ×1 IMPLANT
BAG ZIPLOCK 12X15 (MISCELLANEOUS) IMPLANT
BENZOIN TINCTURE PRP APPL 2/3 (GAUZE/BANDAGES/DRESSINGS) IMPLANT
BLADE SAW SGTL 18X1.27X75 (BLADE) ×1 IMPLANT
COVER PERINEAL POST (MISCELLANEOUS) ×1 IMPLANT
COVER SURGICAL LIGHT HANDLE (MISCELLANEOUS) ×1 IMPLANT
CUP ACETAB W/GRIPTION 54 (Plate) IMPLANT
DRAPE FOOT SWITCH (DRAPES) ×1 IMPLANT
DRAPE STERI IOBAN 125X83 (DRAPES) ×1 IMPLANT
DRAPE U-SHAPE 47X51 STRL (DRAPES) ×2 IMPLANT
DRSG AQUACEL AG ADV 3.5X10 (GAUZE/BANDAGES/DRESSINGS) ×1 IMPLANT
DURAPREP 26ML APPLICATOR (WOUND CARE) ×1 IMPLANT
ELECT PENCIL ROCKER SW 15FT (MISCELLANEOUS) ×1 IMPLANT
ELECT REM PT RETURN 15FT ADLT (MISCELLANEOUS) ×1 IMPLANT
GAUZE XEROFORM 1X8 LF (GAUZE/BANDAGES/DRESSINGS) IMPLANT
GLOVE BIO SURGEON STRL SZ7.5 (GLOVE) ×1 IMPLANT
GLOVE BIOGEL PI IND STRL 8 (GLOVE) ×2 IMPLANT
GLOVE ECLIPSE 8.0 STRL XLNG CF (GLOVE) ×1 IMPLANT
GOWN STRL REUS W/ TWL XL LVL3 (GOWN DISPOSABLE) ×2 IMPLANT
HEAD CERAMIC 36 PLUS5 (Hips) IMPLANT
HOLDER FOLEY CATH W/STRAP (MISCELLANEOUS) ×1 IMPLANT
KIT TURNOVER KIT A (KITS) IMPLANT
LINER NEUTRAL 54X36MM PLUS 4 (Hips) IMPLANT
PACK ANTERIOR HIP CUSTOM (KITS) ×1 IMPLANT
SET HNDPC FAN SPRY TIP SCT (DISPOSABLE) ×1 IMPLANT
STAPLER SKIN PROX WIDE 3.9 (STAPLE) IMPLANT
STEM FEMORAL SZ5 HIGH ACTIS (Stem) IMPLANT
STRIP CLOSURE SKIN 1/2X4 (GAUZE/BANDAGES/DRESSINGS) IMPLANT
SUT ETHIBOND NAB CT1 #1 30IN (SUTURE) ×1 IMPLANT
SUT ETHILON 2 0 PS N (SUTURE) IMPLANT
SUT MNCRL AB 4-0 PS2 18 (SUTURE) IMPLANT
SUT VIC AB 0 CT1 36 (SUTURE) ×1 IMPLANT
SUT VIC AB 1 CT1 36 (SUTURE) ×1 IMPLANT
SUT VIC AB 2-0 CT1 TAPERPNT 27 (SUTURE) ×2 IMPLANT
TRAY FOLEY MTR SLVR 14FR STAT (SET/KITS/TRAYS/PACK) IMPLANT
YANKAUER SUCT BULB TIP NO VENT (SUCTIONS) ×1 IMPLANT

## 2023-04-18 NOTE — Anesthesia Procedure Notes (Signed)
 Spinal  Start time: 04/18/2023 10:08 AM End time: 04/18/2023 10:10 AM Reason for block: surgical anesthesia Staffing Performed: anesthesiologist  Anesthesiologist: Shelton Silvas, MD Performed by: Shelton Silvas, MD Authorized by: Shelton Silvas, MD   Preanesthetic Checklist Completed: patient identified, IV checked, site marked, risks and benefits discussed, surgical consent, monitors and equipment checked, pre-op evaluation and timeout performed Spinal Block Patient position: sitting Prep: DuraPrep and site prepped and draped Location: L3-4 Injection technique: single-shot Needle Needle type: Pencan  Needle gauge: 24 G Needle length: 10 cm Needle insertion depth: 10 cm Additional Notes Patient tolerated well. No immediate complications.  Functioning IV was confirmed and monitors were applied. Sterile prep and drape, including hand hygiene and sterile gloves were used. The patient was positioned and the back was prepped. The skin was anesthetized with lidocaine. Free flow of clear CSF was obtained prior to injecting local anesthetic into the CSF. The spinal needle aspirated freely following injection. The needle was carefully withdrawn. The patient tolerated the procedure well.

## 2023-04-18 NOTE — Op Note (Signed)
 Operative Note  Date of operation: 04/18/2023 Preoperative diagnosis: Left hip primary osteoarthritis Postoperative diagnosis: Same  Procedure: Left direct anterior total hip arthroplasty  Implants: Implant Name Type Inv. Item Serial No. Manufacturer Lot No. LRB No. Used Action  ACETAB CUP W/GRIPTION 54 - [id number] Plate ACETAB CUP W/GRIPTION 54  [employer] [id number] Left 1 Implanted  LINER NEUTRAL 54X36MM PLUS 4 - [id number] Hips LINER NEUTRAL 54X36MM PLUS 4  DEPUY [employer]TH[employer] [id number] Left 1 Implanted  STEM FEMORAL SZ5 HIGH ACTIS - [id number] Stem STEM FEMORAL SZ5 HIGH ACTIS  [employer]PU[employer]TH[employer] [id number] Left 1 Implanted  HEAD CERAMIC 36 PLUS5 - [id number] Hips HEAD CERAMIC 36 PLUS5  [employer]PU[employer] [id number] Left 1 Implanted   Surgeon: Vanita Panda. Magnus Ivan, MD Assistant: Rexene Edison, PA-C  Anesthesia: Spinal Antibiotics: IV Ancef EBL: 150 cc Complications: None  Indications: The patient is a 73 year old active female with debilitating arthritis involving actually both of her hips.  She has tried and failed conservative treatment for over a year.  At this point her hip pain is daily and it is 10 out of 10.  It is detrimentally affecting her mobility, her quality of life and her actives daily living.  She does wish to proceed with a hip replacement on left side today and we agree with this as well based on her clinical exam and x-ray findings combined with the failure conservative treatment.  We did discuss the risks of acute blood loss anemia, nerve or vessel injury, fracture, infection, dislocation, DVT, implant failure, leg length differences and wound healing issues.  We described the goals of hopefully decreased pain, improved mobility, and improved quality of life.  Procedure description: After informed consent was obtained and the appropriate left hip was marked, the patient was brought to the operating room and set up on the stretcher where spinal anesthesia was obtained.   She was then laid in the supine position on the stretcher and a Foley catheter was placed.  I assessed her leg lengths and she is definitely shorter on the left and the right side.  However she does have severe disease on the right hip.  Traction boots were placed on both her feet and neck she is placed supine on the Hana fracture table with a perineal post and placed in both legs in inline skeletal traction devices but no traction applied.  Her left operative hip and pelvis were assessed radiographically.  The left hip was prepped and draped in DuraPrep and sterile drapes.  A timeout was called and she was identified as the correct patient the correct left hip.  An incision was then made just inferior and posterior to the ASIS and carried slightly obliquely down the leg.  Dissection was carried down to the tensor fascia lata muscle and the tensor fascia was divided longitudinally to proceed with a direct interposed the hip.  Circumflex vessels were identified and cauterized.  The hip capsule was identified and opened up in L-type format finding a moderate joint effusion and significant arthritis around the lateral femoral head neck.  A femoral neck cut was then made with an oscillating saw just proximal to the lesser trochanter and this cut was completed with an osteotome.  A corkscrew guide was placed in the femoral head and the femoral head was removed its entirety and there was a wide area completely devoid of cartilage.  A bent Hohmann was then placed over the medial acetabular rim and remnants of the acetabular labrum and other debris removed.  Reaming  was then initiated from a size 43 reamer and stepwise increments going up to a size 53 reamer with all reamers placed under direct visualization and the last reamer also placed under direct fluoroscopy in order to obtain the depth of reaming, the inclination and the anteversion.  The real DePuy Sectra GRIPTION acetabular component size 54 was then placed without  difficulty followed by a 36+4 polythene liner.  Attention was then turned to the femur.  With the left leg externally rotated to 120 degrees, extended and adducted, a Mueller retractor was placed medially and a Hohmann tractor behind the greater trochanter.  The lateral joint capsule was released and a box cutting osteotome was used into the femoral canal.  Broaching was then initiated using the Actis broaching system from a size 0 going to a size 5.  With a size 5 in place we trialed a high offset femoral neck and a 36+1.5 trial hip ball.  The left leg was brought over and up and with traction and internal rotation reduced in the pelvis.  Based on radiographic and clinical assessment we wanted to go with a little bit more leg length.  We dislocated the hip and removed the trial components.  We then placed the real Actis femoral component with high offset size 5 and 1 with a real 36+5 ceramic head ball.  Again this was reduced in the pelvis and we are pleased with range of motion, offset, stability and leg length assessed clinically and radiographically.  We then irrigated the soft tissue with normal saline solution.  Remnants of the joint capsule were closed with interrupted #1 Ethibond suture followed by normal Vicryl to close the tensor fascia.  0 Vicryl was used to close the deep tissue and 2-0 Vicryl was used to close subcutaneous tissue.  The skin was closed with staples.  An Aquacel dressing was applied.  The patient was taken off of the Hana table and taken to the recovery room.  Rexene Edison, PA-C did assist during the entire case and beginning to end and his assistance was medically necessary and crucial for soft tissue management and retraction, helping guide implant placement and a layered closure of the wound.

## 2023-04-18 NOTE — Anesthesia Postprocedure Evaluation (Addendum)
 Anesthesia Post Note  Patient: Colleen Wiley  Procedure(s) Performed: ARTHROPLASTY, HIP, TOTAL, ANTERIOR APPROACH (Left: Hip)     Patient location during evaluation: PACU Anesthesia Type: Spinal Level of consciousness: oriented and awake and alert Pain management: pain level controlled Vital Signs Assessment: post-procedure vital signs reviewed and stable Respiratory status: spontaneous breathing, respiratory function stable and patient connected to nasal cannula oxygen Cardiovascular status: blood pressure returned to baseline and stable Postop Assessment: no headache, no backache and no apparent nausea or vomiting Anesthetic complications: no  No notable events documented.  Last Vitals:  Vitals:   04/18/23 1311 04/18/23 1319  BP: 101/70 96/72  Pulse: (!) 59 85  Resp: 18 16  Temp: (!) 36.4 C 36.4 C  SpO2: 99% 95%                Shelton Silvas

## 2023-04-18 NOTE — Evaluation (Signed)
 Physical Therapy Evaluation Patient Details Name: Colleen Wiley MRN: [id number] DOB: 1950/09/03 Today's Date: 04/18/2023  History of Present Illness  Pt s/p L THR  Clinical Impression  Pt s/p L THR and presents with decreased L LE strength/ROM and post op pain limiting functional mobility.  Pt should progress to dc home with family assist.        If plan is discharge home, recommend the following: A little help with walking and/or transfers;A little help with bathing/dressing/bathroom;Assistance with cooking/housework;Assist for transportation;Help with stairs or ramp for entrance   Can travel by private vehicle        Equipment Recommendations None recommended by PT  Recommendations for Other Services       Functional Status Assessment Patient has had a recent decline in their functional status and demonstrates the ability to make significant improvements in function in a reasonable and predictable amount of time.     Precautions / Restrictions Precautions Precautions: Fall Restrictions Weight Bearing Restrictions Per Provider Order: No LLE Weight Bearing Per Provider Order: Weight bearing as tolerated      Mobility  Bed Mobility Overal bed mobility: Needs Assistance Bed Mobility: Supine to Sit     Supine to sit: Min assist     General bed mobility comments: cues for sequence and use of R LE to self assist    Transfers Overall transfer level: Needs assistance Equipment used: Rolling walker (2 wheels) Transfers: Sit to/from Stand Sit to Stand: Min assist           General transfer comment: cues for LE management and use of UEs to self assist    Ambulation/Gait Ambulation/Gait assistance: Min assist Gait Distance (Feet): 100 Feet Assistive device: Rolling walker (2 wheels) Gait Pattern/deviations: Step-to pattern, Step-through pattern, Decreased step length - right, Decreased step length - left, Shuffle, Trunk flexed Gait velocity: decr     General  Gait Details: cues for posture, position from RW and initial sequence  Stairs            Wheelchair Mobility     Tilt Bed    Modified Rankin (Stroke Patients Only)       Balance Overall balance assessment: Needs assistance Sitting-balance support: No upper extremity supported, Feet supported Sitting balance-Leahy Scale: Good     Standing balance support: No upper extremity supported Standing balance-Leahy Scale: Fair                               Pertinent Vitals/Pain Pain Assessment Pain Assessment: 0-10 Pain Score: 6  Pain Location: L hip Pain Descriptors / Indicators: Aching, Sore Pain Intervention(s): Limited activity within patient's tolerance, Monitored during session, Premedicated before session, Ice applied    Home Living Family/patient expects to be discharged to:: Private residence Living Arrangements: Spouse/significant other Available Help at Discharge: Family;Available 24 hours/day Type of Home: House Home Access: Stairs to enter Entrance Stairs-Rails: Right Entrance Stairs-Number of Steps: 5   Home Layout: One level Home Equipment: Agricultural consultant (2 wheels);Cane - single point;BSC/3in1;Shower seat;Lift chair      Prior Function Prior Level of Function : Independent/Modified Independent                     Extremity/Trunk Assessment   Upper Extremity Assessment Upper Extremity Assessment: Overall WFL for tasks assessed    Lower Extremity Assessment Lower Extremity Assessment: LLE deficits/detail    Cervical / Trunk Assessment Cervical / Trunk Assessment:  Normal  Communication   Communication Communication: No apparent difficulties    Cognition Arousal: Alert Behavior During Therapy: WFL for tasks assessed/performed   PT - Cognitive impairments: No apparent impairments                         Following commands: Intact       Cueing Cueing Techniques: Verbal cues     General Comments       Exercises Total Joint Exercises Ankle Circles/Pumps: AROM, Both, 15 reps, Supine   Assessment/Plan    PT Assessment Patient needs continued PT services  PT Problem List Decreased strength;Decreased range of motion;Decreased activity tolerance;Decreased balance;Decreased mobility;Decreased knowledge of use of DME;Pain       PT Treatment Interventions DME instruction;Gait training;Stair training;Functional mobility training;Therapeutic activities;Therapeutic exercise;Patient/family education    PT Goals (Current goals can be found in the Care Plan section)  Acute Rehab PT Goals Patient Stated Goal: Regain IND PT Goal Formulation: With patient Time For Goal Achievement: 04/25/23 Potential to Achieve Goals: Good    Frequency 7X/week     Co-evaluation               AM-PAC PT "6 Clicks" Mobility  Outcome Measure Help needed turning from your back to your side while in a flat bed without using bedrails?: A Little Help needed moving from lying on your back to sitting on the side of a flat bed without using bedrails?: A Little Help needed moving to and from a bed to a chair (including a wheelchair)?: A Little Help needed standing up from a chair using your arms (e.g., wheelchair or bedside chair)?: A Little Help needed to walk in hospital room?: A Little Help needed climbing 3-5 steps with a railing? : A Lot 6 Click Score: 17    End of Session Equipment Utilized During Treatment: Gait belt Activity Tolerance: Patient tolerated treatment well Patient left: in chair;with call bell/phone within reach;with nursing/sitter in room Nurse Communication: Mobility status PT Visit Diagnosis: Difficulty in walking, not elsewhere classified (R26.2)    Time: 9562-1308 PT Time Calculation (min) (ACUTE ONLY): 27 min   Charges:   PT Evaluation $PT Eval Low Complexity: 1 Low PT Treatments $Gait Training: 8-22 mins PT General Charges $$ ACUTE PT VISIT: 1 Visit         Mauro Kaufmann PT Acute Rehabilitation Services Pager [phone] Office [phone]   Colleen Wiley 04/18/2023, 5:24 PM

## 2023-04-18 NOTE — Transfer of Care (Signed)
 Immediate Anesthesia Transfer of Care Note  Patient: Colleen Wiley  Procedure(s) Performed: ARTHROPLASTY, HIP, TOTAL, ANTERIOR APPROACH (Left: Hip)  Patient Location: PACU  Anesthesia Type:Spinal  Level of Consciousness: awake, alert , and oriented  Airway & Oxygen Therapy: Patient Spontanous Breathing and Patient connected to face mask oxygen  Post-op Assessment: Report given to RN and Post -op Vital signs reviewed and stable  Post vital signs: Reviewed and stable  Last Vitals:  Vitals Value Taken Time  BP    Temp    Pulse 80 04/18/23 1140  Resp 12 04/18/23 1140  SpO2 98 % 04/18/23 1140  Vitals shown include unfiled device data.  Last Pain:  Vitals:   04/18/23 0828  TempSrc:   PainSc: 5          Complications: No notable events documented.

## 2023-04-18 NOTE — Interval H&P Note (Signed)
 History and Physical Interval Note: The patient understands that she is here today for a left total hip replacement to treat her significant left hip arthritis and pain.  There has been no acute or interval change in her medical status.  The risks and benefits of surgery have been discussed in detail and informed consent has been obtained.  The left operative hip has been marked.  04/18/2023 8:35 AM  Colleen Wiley  has presented today for surgery, with the diagnosis of osteoarthritis left hip.  The various methods of treatment have been discussed with the patient and family. After consideration of risks, benefits and other options for treatment, the patient has consented to  Procedure(s): ARTHROPLASTY, HIP, TOTAL, ANTERIOR APPROACH (Left) as a surgical intervention.  The patient's history has been reviewed, patient examined, no change in status, stable for surgery.  I have reviewed the patient's chart and labs.  Questions were answered to the patient's satisfaction.     Kathryne Hitch

## 2023-04-18 NOTE — Anesthesia Preprocedure Evaluation (Addendum)
 Anesthesia Evaluation  Patient identified by MRN, date of birth, ID band Patient awake    Reviewed: Allergy & Precautions, NPO status , Patient's Chart, lab work & pertinent test results  Airway Mallampati: II  TM Distance: >3 FB Neck ROM: Full    Dental  (+) Teeth Intact, Dental Advisory Given   Pulmonary neg pulmonary ROS   breath sounds clear to auscultation       Cardiovascular hypertension, Pt. on medications  Rhythm:Regular Rate:Normal     Neuro/Psych negative neurological ROS  negative psych ROS   GI/Hepatic negative GI ROS, Neg liver ROS,,,  Endo/Other  negative endocrine ROS    Renal/GU negative Renal ROS     Musculoskeletal  (+) Arthritis ,    Abdominal   Peds  Hematology negative hematology ROS (+)   Anesthesia Other Findings   Reproductive/Obstetrics                             Anesthesia Physical Anesthesia Plan  ASA: 2  Anesthesia Plan: Spinal   Post-op Pain Management: Tylenol PO (pre-op)*   Induction: Intravenous  PONV Risk Score and Plan: 3 and Ondansetron, Dexamethasone, Propofol infusion and Midazolam  Airway Management Planned: Natural Airway and Nasal Cannula  Additional Equipment: None  Intra-op Plan:   Post-operative Plan:   Informed Consent: I have reviewed the patients History and Physical, chart, labs and discussed the procedure including the risks, benefits and alternatives for the proposed anesthesia with the patient or authorized representative who has indicated his/her understanding and acceptance.       Plan Discussed with: CRNA  Anesthesia Plan Comments: (Lab Results      Component                Value               Date                      WBC                      10.4                04/15/2023                HGB                      15.8 (H)            04/15/2023                HCT                      47.9 (H)            04/15/2023                 MCV                      92.3                04/15/2023                PLT                      437 (H)             04/15/2023           )  Anesthesia Quick Evaluation

## 2023-04-18 NOTE — Plan of Care (Signed)
   Problem: Coping: Goal: Level of anxiety will decrease Outcome: Progressing   Problem: Pain Managment: Goal: General experience of comfort will improve and/or be controlled Outcome: Progressing   Problem: Safety: Goal: Ability to remain free from injury will improve Outcome: Progressing

## 2023-04-18 NOTE — TOC Transition Note (Signed)
 Transition of Care [facility]) - Discharge Note   Patient Details  Name: Colleen Wiley MRN: [id number] Date of Birth: Mar 12, 1950  Transition of Care [facility]) CM/SW Contact:  Howell Rucks, RN Phone Number: 04/18/2023, 2:55 PM   Clinical Narrative:  Met with pt at bedside to review dc therapy and home equipment needs, pt confirmed HH PT with [facility], has RW, no home DME needs. No TOC needs.      Final next level of care: Home w Home Health Services Barriers to Discharge: No Barriers Identified   Patient Goals and CMS Choice Patient states their goals for this hospitalization and ongoing recovery are:: returm home          Discharge Placement                       Discharge Plan and Services Additional resources added to the After Visit Summary for                                       Social Drivers of Health (SDOH) Interventions SDOH Screenings   Food Insecurity: No Food Insecurity (04/18/2023)  Housing: Low Risk  (04/18/2023)  Transportation Needs: No Transportation Needs (04/18/2023)  Utilities: Not At Risk (04/18/2023)  Social Connections: Moderately Integrated (04/18/2023)  Tobacco Use: Low Risk  (04/18/2023)     Readmission Risk Interventions     No data to display

## 2023-04-19 DIAGNOSIS — M1612 Unilateral primary osteoarthritis, left hip: Secondary | ICD-10-CM | POA: Diagnosis not present

## 2023-04-19 LAB — CBC
HCT: 40.7 % (ref 36.0–46.0)
Hemoglobin: 13.4 g/dL (ref 12.0–15.0)
MCH: 30.7 pg (ref 26.0–34.0)
MCHC: 32.9 g/dL (ref 30.0–36.0)
MCV: 93.3 fL (ref 80.0–100.0)
Platelets: 338 10*3/uL (ref 150–400)
RBC: 4.36 MIL/uL (ref 3.87–5.11)
RDW: 12.5 % (ref 11.5–15.5)
WBC: 16.7 10*3/uL — ABNORMAL HIGH (ref 4.0–10.5)
nRBC: 0 % (ref 0.0–0.2)

## 2023-04-19 LAB — BASIC METABOLIC PANEL WITH GFR
Anion gap: 8 (ref 5–15)
BUN: 12 mg/dL (ref 8–23)
CO2: 26 mmol/L (ref 22–32)
Calcium: 9.2 mg/dL (ref 8.9–10.3)
Chloride: 104 mmol/L (ref 98–111)
Creatinine, Ser: 0.59 mg/dL (ref 0.44–1.00)
GFR, Estimated: >60 mL/min (ref >60)
Glucose, Bld: 130 mg/dL — ABNORMAL HIGH (ref 70–99)
Potassium: 3.6 mmol/L (ref 3.5–5.1)
Sodium: 138 mmol/L (ref 135–145)

## 2023-04-19 MED ORDER — ASPIRIN 81 MG PO CHEW: 81.0000 mg | CHEWABLE_TABLET | Freq: Two times a day (BID) | ORAL | 0 refills | Status: DC

## 2023-04-19 MED ORDER — OXYCODONE HCL 5 MG PO TABS
5.0000 mg | ORAL_TABLET | Freq: Four times a day (QID) | ORAL | 0 refills | Status: DC | PRN
Start: 2023-04-19 — End: 2023-06-03

## 2023-04-19 MED ORDER — METHOCARBAMOL 500 MG PO TABS: 500.0000 mg | ORAL_TABLET | Freq: Four times a day (QID) | ORAL | 1 refills | Status: DC | PRN

## 2023-04-19 NOTE — Discharge Instructions (Signed)

## 2023-04-19 NOTE — Progress Notes (Signed)
 Subjective: 1 Day Post-Op Procedure(s) (LRB): ARTHROPLASTY, HIP, TOTAL, ANTERIOR APPROACH (Left) Patient reports pain as mild.  Has done very well with therapy and has been cleared.  Objective: Vital signs in last 24 hours: Temp:  [97.5 F (36.4 C)-98.6 F (37 C)] 98 F (36.7 C) (04/05 0932) Pulse Rate:  [57-102] 75 (04/05 0606) Resp:  [12-19] 17 (04/05 0932) BP: (84-138)/(59-101) 112/78 (04/05 0932) SpO2:  [89 %-100 %] 93 % (04/05 0932)  Intake/Output from previous day: 04/04 0701 - 04/05 0700 In: 2755 [P.O.:740; I.V.:1715; IV Piggyback:300] Out: 1925 [Urine:1625; Blood:300] Intake/Output this shift: Total I/O In: -  Out: 600 [Urine:600]  Recent Labs    04/19/23 0334  HGB 13.4   Recent Labs    04/19/23 0334  WBC 16.7*  RBC 4.36  HCT 40.7  PLT 338   Recent Labs    04/19/23 0334  NA 138  K 3.6  CL 104  CO2 26  BUN 12  CREATININE 0.59  GLUCOSE 130*  CALCIUM 9.2   No results for input(s): "LABPT", "INR" in the last 72 hours.  Sensation intact distally Intact pulses distally Dorsiflexion/Plantar flexion intact Incision: scant drainage   Assessment/Plan: 1 Day Post-Op Procedure(s) (LRB): ARTHROPLASTY, HIP, TOTAL, ANTERIOR APPROACH (Left) Discharge home with home health      Colleen Wiley 04/19/2023, 10:41 AM

## 2023-04-19 NOTE — Progress Notes (Signed)
 Physical Therapy Treatment Patient Details Name: Colleen Wiley MRN: [id number] DOB: 1950-09-17 Today's Date: 04/19/2023   History of Present Illness Pt s/p L THR    PT Comments  Pt motivated and progressing well with mobility.  Pt hopeful for dc home this date.  Will follow up with stair training once spouse has arrived.    If plan is discharge home, recommend the following: A little help with walking and/or transfers;A little help with bathing/dressing/bathroom;Assistance with cooking/housework;Assist for transportation;Help with stairs or ramp for entrance   Can travel by private vehicle        Equipment Recommendations  None recommended by PT    Recommendations for Other Services       Precautions / Restrictions Precautions Precautions: Fall Restrictions Weight Bearing Restrictions Per Provider Order: No LLE Weight Bearing Per Provider Order: Weight bearing as tolerated     Mobility  Bed Mobility               General bed mobility comments: Pt up in chair and requests back to same    Transfers Overall transfer level: Needs assistance Equipment used: Rolling walker (2 wheels) Transfers: Sit to/from Stand Sit to Stand: Contact guard assist           General transfer comment: cues for LE management and use of UEs to self assist    Ambulation/Gait Ambulation/Gait assistance: Min assist, Contact guard assist Gait Distance (Feet): 150 Feet Assistive device: Rolling walker (2 wheels) Gait Pattern/deviations: Step-to pattern, Step-through pattern, Decreased step length - right, Decreased step length - left, Shuffle, Trunk flexed Gait velocity: decr     General Gait Details: cues for posture, position from RW and initial sequence   Stairs             Wheelchair Mobility     Tilt Bed    Modified Rankin (Stroke Patients Only)       Balance Overall balance assessment: Needs assistance Sitting-balance support: No upper extremity supported,  Feet supported Sitting balance-Leahy Scale: Good     Standing balance support: No upper extremity supported Standing balance-Leahy Scale: Fair                              Hotel manager: No apparent difficulties  Cognition Arousal: Alert Behavior During Therapy: WFL for tasks assessed/performed   PT - Cognitive impairments: No apparent impairments                         Following commands: Intact      Cueing Cueing Techniques: Verbal cues  Exercises Total Joint Exercises Ankle Circles/Pumps: AROM, Both, 15 reps, Supine Quad Sets: AROM, Both, 10 reps, Supine Heel Slides: AAROM, Left, 20 reps, Supine Hip ABduction/ADduction: AAROM, Left, 15 reps, Supine    General Comments        Pertinent Vitals/Pain Pain Assessment Pain Assessment: 0-10 Pain Score: 3  Pain Location: L hip Pain Descriptors / Indicators: Aching, Sore Pain Intervention(s): Limited activity within patient's tolerance, Monitored during session, Premedicated before session, Ice applied    Home Living                          Prior Function            PT Goals (current goals can now be found in the care plan section) Acute Rehab PT Goals Patient Stated Goal: Regain  IND PT Goal Formulation: With patient Time For Goal Achievement: 04/25/23 Potential to Achieve Goals: Good Progress towards PT goals: Progressing toward goals    Frequency    7X/week      PT Plan      Co-evaluation              AM-PAC PT "6 Clicks" Mobility   Outcome Measure  Help needed turning from your back to your side while in a flat bed without using bedrails?: A Little Help needed moving from lying on your back to sitting on the side of a flat bed without using bedrails?: A Little Help needed moving to and from a bed to a chair (including a wheelchair)?: A Little Help needed standing up from a chair using your arms (e.g., wheelchair or bedside  chair)?: A Little Help needed to walk in hospital room?: A Little Help needed climbing 3-5 steps with a railing? : A Lot 6 Click Score: 17    End of Session Equipment Utilized During Treatment: Gait belt Activity Tolerance: Patient tolerated treatment well Patient left: in chair;with call bell/phone within reach;with nursing/sitter in room Nurse Communication: Mobility status PT Visit Diagnosis: Difficulty in walking, not elsewhere classified (R26.2)     Time: 1610-9604 PT Time Calculation (min) (ACUTE ONLY): 26 min  Charges:    $Gait Training: 8-22 mins $Therapeutic Exercise: 8-22 mins PT General Charges $$ ACUTE PT VISIT: 1 Visit                     Mauro Kaufmann PT Acute Rehabilitation Services Pager [phone] Office [phone]    Tacarra Justo 04/19/2023, 12:58 PM

## 2023-04-19 NOTE — Progress Notes (Signed)
 Physical Therapy Treatment Patient Details Name: Colleen Wiley MRN: [id number] DOB: 28-Nov-1950 Today's Date: 04/19/2023   History of Present Illness Pt s/p L THR    PT Comments  Pt progressing well with mobility including up to ambulate in hall, negotiated stairs, reviewed car transfers, and reviewed dressing techniques.  Spouse present for session.  Pt eager for dc home this date.    If plan is discharge home, recommend the following: A little help with walking and/or transfers;A little help with bathing/dressing/bathroom;Assistance with cooking/housework;Assist for transportation;Help with stairs or ramp for entrance   Can travel by private vehicle        Equipment Recommendations  None recommended by PT    Recommendations for Other Services       Precautions / Restrictions Precautions Precautions: Fall Restrictions Weight Bearing Restrictions Per Provider Order: No LLE Weight Bearing Per Provider Order: Weight bearing as tolerated     Mobility  Bed Mobility               General bed mobility comments: Pt up in chair and requests back to same    Transfers Overall transfer level: Needs assistance Equipment used: Rolling walker (2 wheels) Transfers: Sit to/from Stand Sit to Stand: Contact guard assist           General transfer comment: cues for LE management and use of UEs to self assist    Ambulation/Gait Ambulation/Gait assistance: Contact guard assist, Supervision Gait Distance (Feet): 75 Feet Assistive device: Rolling walker (2 wheels) Gait Pattern/deviations: Step-to pattern, Step-through pattern, Decreased step length - right, Decreased step length - left, Shuffle, Trunk flexed Gait velocity: decr     General Gait Details: cues for posture, position from RW and initial sequence   Stairs Stairs: Yes Stairs assistance: Min assist Stair Management: One rail Left, Step to pattern, Forwards, With cane Number of Stairs: 5 General stair comments:  2+3 stairs with cues for sequence and foot/cane placement; spouse present; written instruction provided   Wheelchair Mobility     Tilt Bed    Modified Rankin (Stroke Patients Only)       Balance Overall balance assessment: Needs assistance Sitting-balance support: No upper extremity supported, Feet supported Sitting balance-Leahy Scale: Good     Standing balance support: No upper extremity supported Standing balance-Leahy Scale: Fair                              Hotel manager: No apparent difficulties  Cognition Arousal: Alert Behavior During Therapy: WFL for tasks assessed/performed   PT - Cognitive impairments: No apparent impairments                         Following commands: Intact      Cueing Cueing Techniques: Verbal cues  Exercises Total Joint Exercises Ankle Circles/Pumps: AROM, Both, 15 reps, Supine Quad Sets: AROM, Both, 10 reps, Supine Heel Slides: AAROM, Left, 20 reps, Supine Hip ABduction/ADduction: AAROM, Left, 15 reps, Supine    General Comments        Pertinent Vitals/Pain Pain Assessment Pain Assessment: 0-10 Pain Score: 3  Pain Location: L hip Pain Descriptors / Indicators: Aching, Sore Pain Intervention(s): Limited activity within patient's tolerance, Monitored during session, Premedicated before session, Ice applied    Home Living  Prior Function            PT Goals (current goals can now be found in the care plan section) Acute Rehab PT Goals Patient Stated Goal: Regain IND PT Goal Formulation: With patient Time For Goal Achievement: 04/25/23 Potential to Achieve Goals: Good Progress towards PT goals: Progressing toward goals    Frequency    7X/week      PT Plan      Co-evaluation              AM-PAC PT "6 Clicks" Mobility   Outcome Measure  Help needed turning from your back to your side while in a flat bed without using  bedrails?: A Little Help needed moving from lying on your back to sitting on the side of a flat bed without using bedrails?: A Little Help needed moving to and from a bed to a chair (including a wheelchair)?: A Little Help needed standing up from a chair using your arms (e.g., wheelchair or bedside chair)?: A Little Help needed to walk in hospital room?: A Little Help needed climbing 3-5 steps with a railing? : A Little 6 Click Score: 18    End of Session Equipment Utilized During Treatment: Gait belt Activity Tolerance: Patient tolerated treatment well Patient left: in chair;with call bell/phone within reach;with nursing/sitter in room Nurse Communication: Mobility status PT Visit Diagnosis: Difficulty in walking, not elsewhere classified (R26.2)     Time: 1010-1032 PT Time Calculation (min) (ACUTE ONLY): 22 min  Charges:    $Gait Training: 8-22 mins $Therapeutic Exercise: 8-22 mins $Therapeutic Activity: 8-22 mins PT General Charges $$ ACUTE PT VISIT: 1 Visit                     Mauro Kaufmann PT Acute Rehabilitation Services Pager [phone] Office [phone]    Brizeida Mcmurry 04/19/2023, 1:02 PM

## 2023-04-19 NOTE — Discharge Summary (Signed)
 Patient ID: Colleen Wiley MRN: [id number] DOB/AGE: 1950-10-30 73 y.o.  Admit date: 04/18/2023 Discharge date: 04/19/2023  Admission Diagnoses:  Principal Problem:   Unilateral primary osteoarthritis, left hip Active Problems:   Status post total replacement of left hip   Discharge Diagnoses:  Same  Past Medical History:  Diagnosis Date   Arthritis    Hypertension     Surgeries: Procedure(s): ARTHROPLASTY, HIP, TOTAL, ANTERIOR APPROACH on 04/18/2023   Consultants:   Discharged Condition: Improved  Hospital Course: Colleen Wiley is an 73 y.o. female who was admitted 04/18/2023 for operative treatment ofUnilateral primary osteoarthritis, left hip. Patient has severe unremitting pain that affects sleep, daily activities, and work/hobbies. After pre-op clearance the patient was taken to the operating room on 04/18/2023 and underwent  Procedure(s): ARTHROPLASTY, HIP, TOTAL, ANTERIOR APPROACH.    Patient was given perioperative antibiotics:  Anti-infectives (From admission, onward)    Start     Dose/Rate Route Frequency Ordered Stop   04/18/23 1600  ceFAZolin (ANCEF) IVPB 2g/100 mL premix        2 g 200 mL/hr over 30 Minutes Intravenous Every 6 hours 04/18/23 1317 04/18/23 2323   04/18/23 0800  ceFAZolin (ANCEF) IVPB 2g/100 mL premix        2 g 200 mL/hr over 30 Minutes Intravenous On call to O.R. 04/18/23 0745 04/18/23 1014        Patient was given sequential compression devices, early ambulation, and chemoprophylaxis to prevent DVT.  Patient benefited maximally from hospital stay and there were no complications.    Recent vital signs: Patient Vitals for the past 24 hrs:  BP Temp Temp src Pulse Resp SpO2  04/19/23 0932 112/78 98 F (36.7 C) Oral -- 17 93 %  04/19/23 0606 116/83 98.4 F (36.9 C) Oral 75 15 95 %  04/19/23 0140 105/74 98.4 F (36.9 C) Oral 95 17 96 %  04/18/23 2102 122/74 98.6 F (37 C) Oral 96 17 92 %  04/18/23 1715 (!) 138/99 -- -- (!) 102 18 (!) 89 %   04/18/23 1714 (!) 136/101 98.1 F (36.7 C) Oral 91 -- --  04/18/23 1319 96/72 97.6 F (36.4 C) Oral 85 16 95 %  04/18/23 1311 101/70 (!) 97.5 F (36.4 C) -- (!) 59 18 99 %  04/18/23 1305 96/67 -- -- 61 16 97 %  04/18/23 1300 99/66 -- -- 61 16 99 %  04/18/23 1255 97/66 -- -- 66 19 100 %  04/18/23 1250 96/65 -- -- 68 17 100 %  04/18/23 1245 97/65 -- -- 63 14 100 %  04/18/23 1240 98/68 -- -- 67 17 100 %  04/18/23 1235 102/68 -- -- 65 16 99 %  04/18/23 1230 108/71 -- -- (!) 58 16 100 %  04/18/23 1225 117/71 -- -- (!) 58 16 100 %  04/18/23 1220 112/71 -- -- (!) 59 15 100 %  04/18/23 1215 119/75 -- -- (!) 57 15 100 %  04/18/23 1210 124/79 -- -- (!) 59 16 100 %  04/18/23 1205 118/76 -- -- 67 17 100 %  04/18/23 1200 109/71 -- -- 65 16 93 %  04/18/23 1155 105/69 -- -- 60 17 96 %  04/18/23 1153 102/66 -- -- 65 15 95 %  04/18/23 1150 (!) 84/60 -- -- 79 16 97 %  04/18/23 1145 (!) 87/59 -- -- 76 16 100 %  04/18/23 1140 92/60 97.6 F (36.4 C) -- 81 12 98 %     Recent laboratory studies:  Recent Labs    04/19/23 0334  WBC 16.7*  HGB 13.4  HCT 40.7  PLT 338  NA 138  K 3.6  CL 104  CO2 26  BUN 12  CREATININE 0.59  GLUCOSE 130*  CALCIUM 9.2     Discharge Medications:   Allergies as of 04/19/2023   No Known Allergies      Medication List     TAKE these medications    acetaminophen 650 MG CR tablet Commonly known as: TYLENOL Take 1,300 mg by mouth every 8 (eight) hours as needed for pain.   amLODipine 5 MG tablet Commonly known as: NORVASC Take 5 mg by mouth daily.   aspirin 81 MG chewable tablet Chew 1 tablet (81 mg total) by mouth 2 (two) times daily.   hydrochlorothiazide 25 MG tablet Commonly known as: HYDRODIURIL Take 25 mg by mouth daily.   methocarbamol 500 MG tablet Commonly known as: ROBAXIN Take 1 tablet (500 mg total) by mouth every 6 (six) hours as needed for muscle spasms.   oxyCODONE 5 MG immediate release tablet Commonly known as: Oxy  IR/ROXICODONE Take 1-2 tablets (5-10 mg total) by mouth every 6 (six) hours as needed for moderate pain (pain score 4-6) (pain score 4-6).   potassium chloride 10 MEQ tablet Commonly known as: KLOR-CON Take 10 mEq by mouth daily.               Durable Medical Equipment  (From admission, onward)           Start     Ordered   04/18/23 1318  DME 3 n 1  Once        04/18/23 1317   04/18/23 1318  DME Walker rolling  Once       Question Answer Comment  Walker: With 5 Inch Wheels   Patient needs a walker to treat with the following condition Status post total replacement of left hip      04/18/23 1317            Diagnostic Studies: DG Pelvis Portable Result Date: 04/18/2023 CLINICAL DATA:  Status post left total hip arthroplasty. EXAM: PORTABLE PELVIS 1-2 VIEWS COMPARISON:  Jun 07, 2020. FINDINGS: Left femoral and acetabular components are well situated. Expected postoperative changes seen in the surrounding soft tissues. Severe degenerative changes seen involving right hip. IMPRESSION: Status post left total hip arthroplasty. Electronically Signed   By: Lupita Raider M.D.   On: 04/18/2023 15:43   DG HIP UNILAT WITH PELVIS 1V LEFT Result Date: 04/18/2023 CLINICAL DATA:  Elective surgery. EXAM: DG HIP (WITH OR WITHOUT PELVIS) 1V*L* COMPARISON:  None Available. FINDINGS: Three fluoroscopic spot views of the pelvis and left hip obtained in the operating room. Images during hip arthroplasty. Fluoroscopy time 13 seconds. Dose 1.9790 mGy. Advanced right hip arthropathy is partially included. IMPRESSION: Intraoperative fluoroscopy during left hip arthroplasty. Electronically Signed   By: Narda Rutherford M.D.   On: 04/18/2023 11:23   DG C-Arm 1-60 Min-No Report Result Date: 04/18/2023 Fluoroscopy was utilized by the requesting physician.  No radiographic interpretation.    Disposition: Discharge disposition: 01-Home or Self Care          Follow-up Information     Kathryne Hitch, MD Follow up in 2 week(s).   Specialty: Orthopedic Surgery Contact information: [address] [place] [address] [phone]                  Signed: Kathryne Hitch 04/19/2023,  10:43 AM

## 2023-04-21 ENCOUNTER — Encounter (HOSPITAL_COMMUNITY): Payer: Self-pay | Admitting: Orthopaedic Surgery

## 2023-05-01 ENCOUNTER — Encounter: Payer: Self-pay | Admitting: Orthopaedic Surgery

## 2023-05-01 ENCOUNTER — Ambulatory Visit (INDEPENDENT_AMBULATORY_CARE_PROVIDER_SITE_OTHER): Payer: 59 | Admitting: Orthopaedic Surgery

## 2023-05-01 DIAGNOSIS — Z96642 Presence of left artificial hip joint: Secondary | ICD-10-CM

## 2023-05-01 DIAGNOSIS — M1611 Unilateral primary osteoarthritis, right hip: Secondary | ICD-10-CM

## 2023-05-01 NOTE — Progress Notes (Signed)
 The patient is here for her first postoperative visit status post a left total hip replacement to treat significant left hip pain and arthritis.  She does have known significant arthritis of her right hip.  She has been compliant with a baby aspirin twice a day.  She is ambulate with a rolling walker mainly due to the severity of her right hip arthritis.  Her right hip x-rays show complete loss of joint space with bone-on-bone wear.  Her left hip is been the same way as well.  She is of the left hip is doing wonderful.  Her left hip incision looks good.  Staples were removed and Steri-Strips applied.  She had a mild seroma and I did aspirate some fluid from the hip but it was not locked.  Her left hip moves smoothly and fluidly.  The right hip has almost no range of motion at all with severe pain with attempts of motion.  Again her previous x-rays of right hip show bone-on-bone wear.  This point we will work on setting her up for a right total hip replacement to treat severe right hip arthritis.  We will be in touch with scheduling that surgery.  All question concerns were answered addressed.

## 2023-05-05 ENCOUNTER — Encounter: Payer: 59 | Admitting: Orthopaedic Surgery

## 2023-06-04 NOTE — Progress Notes (Addendum)
 Anesthesia Review:  PCP: Cardiologist :  PPM/ ICD: Device Orders: Rep Notified:  Chest x-ray : EKG : 04/15/2023  Echo : Stress test: Cardiac Cath :   Activity level:  Sleep Study/ CPAP : Fasting Blood Sugar :      / Checks Blood Sugar -- times a day:    Blood Thinner/ Instructions /Last Dose: ASA / Instructions/ Last Dose :    04/18/23- left hip

## 2023-06-06 NOTE — Patient Instructions (Signed)
 SURGICAL WAITING ROOM VISITATION  Patients having surgery or a procedure may have no more than 2 support people in the waiting area - these visitors may rotate.    Children under the age of 26 must have an adult with them who is not the patient.  Due to an increase in RSV and influenza rates and associated hospitalizations, children ages 21 and under may not visit patients in [facility] hospitals.  Visitors with respiratory illnesses are discouraged from visiting and should remain at home.  If the patient needs to stay at the hospital during part of their recovery, the visitor guidelines for inpatient rooms apply. Pre-op nurse will coordinate an appropriate time for 1 support person to accompany patient in pre-op.  This support person may not rotate.    Please refer to the [facility] website for the visitor guidelines for Inpatients (after your surgery is over and you are in a regular room).       Your procedure is scheduled on:  06/13/2023    Report to [facility] Main Entrance    Report to admitting at   0515 AM   Call this number if you have problems the morning of surgery [phone]   Do not eat food :After Midnight.   After Midnight you may have the following liquids until _ 0415_____ AM DAY OF SURGERY  Water  Non-Citrus Juices (without pulp, NO RED-Apple, White grape, White cranberry) Black Coffee (NO MILK/CREAM OR CREAMERS, sugar ok)  Clear Tea (NO MILK/CREAM OR CREAMERS, sugar ok) regular and decaf                             Plain Jell-O (NO RED)                                           Fruit ices (not with fruit pulp, NO RED)                                     Popsicles (NO RED)                                                               Sports drinks like Gatorade (NO RED)                    The day of surgery:  Drink ONE (1) Pre-Surgery Clear Ensure or G2 at 0415 AM  ( have completed by )  the morning of surgery. Drink in one sitting. Do not sip.   This drink was given to you during your hospital  pre-op appointment visit. Nothing else to drink after completing the  Pre-Surgery Clear Ensure or G2.          If you have questions, please contact your surgeon's office.       Oral Hygiene is also important to reduce your risk of infection.  Remember - BRUSH YOUR TEETH THE MORNING OF SURGERY WITH YOUR REGULAR TOOTHPASTE  DENTURES WILL BE REMOVED PRIOR TO SURGERY PLEASE DO NOT APPLY "Poly grip" OR ADHESIVES!!!   Do NOT smoke after Midnight   Stop all vitamins and herbal supplements 7 days before surgery.   Take these medicines the morning of surgery with A SIP OF WATER :  amlodipine    DO NOT TAKE ANY ORAL DIABETIC MEDICATIONS DAY OF YOUR SURGERY  Bring CPAP mask and tubing day of surgery.                              You may not have any metal on your body including hair pins, jewelry, and body piercing             Do not wear make-up, lotions, powders, perfumes/cologne, or deodorant  Do not wear nail polish including gel and S&S, artificial/acrylic nails, or any other type of covering on natural nails including finger and toenails. If you have artificial nails, gel coating, etc. that needs to be removed by a nail salon please have this removed prior to surgery or surgery may need to be canceled/ delayed if the surgeon/ anesthesia feels like they are unable to be safely monitored.   Do not shave  48 hours prior to surgery.               Men may shave face and neck.   Do not bring valuables to the hospital. [place] IS NOT             RESPONSIBLE   FOR VALUABLES.   Contacts, glasses, dentures or bridgework may not be worn into surgery.   Bring small overnight bag day of surgery.   DO NOT BRING YOUR HOME MEDICATIONS TO THE HOSPITAL. PHARMACY WILL DISPENSE MEDICATIONS LISTED ON YOUR MEDICATION LIST TO YOU DURING YOUR ADMISSION IN THE HOSPITAL!    Patients discharged on the day of surgery  will not be allowed to drive home.  Someone NEEDS to stay with you for the first 24 hours after anesthesia.   Special Instructions: Bring a copy of your healthcare power of attorney and living will documents the day of surgery if you haven't scanned them before.              Please read over the following fact sheets you were given: IF YOU HAVE QUESTIONS ABOUT YOUR PRE-OP INSTRUCTIONS PLEASE CALL [phone]   If you received a COVID test during your pre-op visit  it is requested that you wear a mask when out in public, stay away from anyone that may not be feeling well and notify your surgeon if you develop symptoms. If you test positive for Covid or have been in contact with anyone that has tested positive in the last 10 days please notify you surgeon.      Pre-operative 5 CHG Bath Instructions   You can play a key role in reducing the risk of infection after surgery. Your skin needs to be as free of germs as possible. You can reduce the number of germs on your skin by washing with CHG (chlorhexidine  gluconate) soap before surgery. CHG is an antiseptic soap that kills germs and continues to kill germs even after washing.   DO NOT use if you have an allergy to chlorhexidine /CHG or antibacterial soaps. If your skin becomes reddened or irritated, stop using the CHG and notify one of our RNs at  [phone].   Please shower with the CHG soap starting 4 days before surgery using the following schedule:     Please keep in mind the following:  DO NOT shave, including legs and underarms, starting the day of your first shower.   You may shave your face at any point before/day of surgery.  Place clean sheets on your bed the day you start using CHG soap. Use a clean washcloth (not used since being washed) for each shower. DO NOT sleep with pets once you start using the CHG.   CHG Shower Instructions:  If you choose to wash your hair and private area, wash first with your normal shampoo/soap.   After you use shampoo/soap, rinse your hair and body thoroughly to remove shampoo/soap residue.  Turn the water  OFF and apply about 3 tablespoons (45 ml) of CHG soap to a CLEAN washcloth.  Apply CHG soap ONLY FROM YOUR NECK DOWN TO YOUR TOES (washing for 3-5 minutes)  DO NOT use CHG soap on face, private areas, open wounds, or sores.  Pay special attention to the area where your surgery is being performed.  If you are having back surgery, having someone wash your back for you may be helpful. Wait 2 minutes after CHG soap is applied, then you may rinse off the CHG soap.  Pat dry with a clean towel  Put on clean clothes/pajamas   If you choose to wear lotion, please use ONLY the CHG-compatible lotions on the back of this paper.     Additional instructions for the day of surgery: DO NOT APPLY any lotions, deodorants, cologne, or perfumes.   Put on clean/comfortable clothes.  Brush your teeth.  Ask your nurse before applying any prescription medications to the skin.      CHG Compatible Lotions   Aveeno Moisturizing lotion  Cetaphil Moisturizing Cream  Cetaphil Moisturizing Lotion  Clairol Herbal Essence Moisturizing Lotion, Dry Skin  Clairol Herbal Essence Moisturizing Lotion, Extra Dry Skin  Clairol Herbal Essence Moisturizing Lotion, Normal Skin  Curel Age Defying Therapeutic Moisturizing Lotion with Alpha Hydroxy  Curel Extreme Care Body Lotion  Curel Soothing Hands Moisturizing Hand Lotion  Curel Therapeutic Moisturizing Cream, Fragrance-Free  Curel Therapeutic Moisturizing Lotion, Fragrance-Free  Curel Therapeutic Moisturizing Lotion, Original Formula  Eucerin Daily Replenishing Lotion  Eucerin Dry Skin Therapy Plus Alpha Hydroxy Crme  Eucerin Dry Skin Therapy Plus Alpha Hydroxy Lotion  Eucerin Original Crme  Eucerin Original Lotion  Eucerin Plus Crme Eucerin Plus Lotion  Eucerin TriLipid Replenishing Lotion  Keri Anti-Bacterial Hand Lotion  Keri Deep Conditioning  Original Lotion Dry Skin Formula Softly Scented  Keri Deep Conditioning Original Lotion, Fragrance Free Sensitive Skin Formula  Keri Lotion Fast Absorbing Fragrance Free Sensitive Skin Formula  Keri Lotion Fast Absorbing Softly Scented Dry Skin Formula  Keri Original Lotion  Keri Skin Renewal Lotion Keri Silky Smooth Lotion  Keri Silky Smooth Sensitive Skin Lotion  Nivea Body Creamy Conditioning Oil  Nivea Body Extra Enriched Teacher, adult education Moisturizing Lotion Nivea Crme  Nivea Skin Firming Lotion  NutraDerm 30 Skin Lotion  NutraDerm Skin Lotion  NutraDerm Therapeutic Skin Cream  NutraDerm Therapeutic Skin Lotion  ProShield Protective Hand Cream  Provon moisturizing lotion

## 2023-06-11 ENCOUNTER — Encounter (HOSPITAL_COMMUNITY)
Admission: RE | Admit: 2023-06-11 | Discharge: 2023-06-11 | Disposition: A | Discharge status: Home / Self Care | Source: Ambulatory Visit | Attending: Orthopaedic Surgery | Admitting: Orthopaedic Surgery

## 2023-06-11 ENCOUNTER — Encounter (HOSPITAL_COMMUNITY): Payer: Self-pay

## 2023-06-11 ENCOUNTER — Other Ambulatory Visit: Payer: Self-pay

## 2023-06-11 VITALS — BP 139/92 | HR 83 | Temp 98.7°F | Resp 16 | Ht 63.5 in | Wt 182.0 lb

## 2023-06-11 DIAGNOSIS — Z01818 Encounter for other preprocedural examination: Secondary | ICD-10-CM | POA: Diagnosis present

## 2023-06-11 DIAGNOSIS — M1611 Unilateral primary osteoarthritis, right hip: Secondary | ICD-10-CM | POA: Insufficient documentation

## 2023-06-11 DIAGNOSIS — Z01812 Encounter for preprocedural laboratory examination: Secondary | ICD-10-CM | POA: Diagnosis present

## 2023-06-11 LAB — CBC
HCT: 46.3 % — ABNORMAL HIGH (ref 36.0–46.0)
Hemoglobin: 15.4 g/dL — ABNORMAL HIGH (ref 12.0–15.0)
MCH: 30.4 pg (ref 26.0–34.0)
MCHC: 33.3 g/dL (ref 30.0–36.0)
MCV: 91.3 fL (ref 80.0–100.0)
Platelets: 418 10*3/uL — ABNORMAL HIGH (ref 150–400)
RBC: 5.07 MIL/uL (ref 3.87–5.11)
RDW: 12.1 % (ref 11.5–15.5)
WBC: 10.4 10*3/uL (ref 4.0–10.5)
nRBC: 0 % (ref 0.0–0.2)

## 2023-06-11 LAB — BASIC METABOLIC PANEL WITH GFR
Anion gap: 10 (ref 5–15)
BUN: 12 mg/dL (ref 8–23)
CO2: 31 mmol/L (ref 22–32)
Calcium: 9.9 mg/dL (ref 8.9–10.3)
Chloride: 100 mmol/L (ref 98–111)
Creatinine, Ser: 0.78 mg/dL (ref 0.44–1.00)
GFR, Estimated: >60 mL/min (ref >60)
Glucose, Bld: 117 mg/dL — ABNORMAL HIGH (ref 70–99)
Potassium: 3.6 mmol/L (ref 3.5–5.1)
Sodium: 141 mmol/L (ref 135–145)

## 2023-06-11 LAB — SURGICAL PCR SCREEN
MRSA, PCR: NEGATIVE
Staphylococcus aureus: POSITIVE — AB

## 2023-06-12 NOTE — H&P (Signed)
 TOTAL HIP ADMISSION H&P  Patient is admitted for right total hip arthroplasty.  Subjective:  Chief Complaint: right hip pain  HPI: Colleen Wiley, 73 y.o. female, has a history of pain and functional disability in the right hip(s) due to arthritis and patient has failed non-surgical conservative treatments for greater than 12 weeks to include NSAID's and/or analgesics, corticosteriod injections, use of assistive devices, and activity modification.  Onset of symptoms was gradual starting 2 years ago with gradually worsening course since that time.The patient noted no past surgery on the right hip(s).  Patient currently rates pain in the right hip at 10 out of 10 with activity. Patient has night pain, worsening of pain with activity and weight bearing, trendelenberg gait, pain that interfers with activities of daily living, and pain with passive range of motion. Patient has evidence of subchondral sclerosis, periarticular osteophytes, and joint space narrowing by imaging studies. This condition presents safety issues increasing the risk of falls.  There is no current active infection.  Patient Active Problem List   Diagnosis Date Noted   Status post total replacement of left hip 04/18/2023   Unilateral primary osteoarthritis, right hip 03/03/2023   Monoallelic mutation of ATM gene 63/87/5643   Past Medical History:  Diagnosis Date   Arthritis    Hypertension     Past Surgical History:  Procedure Laterality Date   CATARACT EXTRACTION Bilateral    COLONOSCOPY     x2   TOTAL HIP ARTHROPLASTY Left 04/18/2023   Procedure: ARTHROPLASTY, HIP, TOTAL, ANTERIOR APPROACH;  Surgeon: Arnie Lao, MD;  Location: [facility];  Service: Orthopedics;  Laterality: Left;    No current facility-administered medications for this encounter.   Current Outpatient Medications  Medication Sig Dispense Refill Last Dose/Taking   amLODipine  (NORVASC ) 5 MG tablet Take 5 mg by mouth in the morning.   Taking    Cholecalciferol (VITAMIN D3 PO) Take 1 tablet by mouth 4 (four) times a week. Chewable   Taking   hydrochlorothiazide  (HYDRODIURIL ) 25 MG tablet Take 25 mg by mouth in the morning.   Taking   potassium chloride  (KLOR-CON ) 10 MEQ tablet Take 10 mEq by mouth in the morning.   Taking   No Known Allergies  Social History   Tobacco Use   Smoking status: Never   Smokeless tobacco: Never  Substance Use Topics   Alcohol use: Not Currently    Comment: social drinker     Family History  Problem Relation Age of Onset   Breast cancer Mother 44   Cancer Mother        neuroendocrine tumor of pancrease    Pancreatic cancer Maternal Uncle    Pancreatic cancer Maternal Uncle    Breast cancer Cousin 24   Cancer Cousin        lung cancer   Ovarian cancer Other    Cancer Other        unknown type of cancer      Review of Systems  Objective:  Physical Exam Vitals reviewed.  Constitutional:      Appearance: Normal appearance.  HENT:     Head: Normocephalic and atraumatic.  Eyes:     Extraocular Movements: Extraocular movements intact.     Pupils: Pupils are equal, round, and reactive to light.  Cardiovascular:     Rate and Rhythm: Normal rate and regular rhythm.     Pulses: Normal pulses.  Pulmonary:     Effort: Pulmonary effort is normal.     Breath  sounds: Normal breath sounds.  Abdominal:     Palpations: Abdomen is soft.  Musculoskeletal:     Cervical back: Normal range of motion and neck supple.     Right hip: Tenderness and bony tenderness present. Decreased range of motion. Decreased strength.  Neurological:     Mental Status: She is alert and oriented to person, place, and time.  Psychiatric:        Behavior: Behavior normal.     Vital signs in last 24 hours:    Labs:   Estimated body mass index is 31.73 kg/m as calculated from the following:   Height as of 06/11/23: 5' 3.5" (1.613 m).   Weight as of 06/11/23: 82.6 kg.   Imaging Review Plain radiographs  demonstrate severe degenerative joint disease of the right hip(s). The bone quality appears to be good for age and reported activity level.      Assessment/Plan:  End stage arthritis, right hip(s)  The patient history, physical examination, clinical judgement of the provider and imaging studies are consistent with end stage degenerative joint disease of the right hip(s) and total hip arthroplasty is deemed medically necessary. The treatment options including medical management, injection therapy, arthroscopy and arthroplasty were discussed at length. The risks and benefits of total hip arthroplasty were presented and reviewed. The risks due to aseptic loosening, infection, stiffness, dislocation/subluxation,  thromboembolic complications and other imponderables were discussed.  The patient acknowledged the explanation, agreed to proceed with the plan and consent was signed. Patient is being admitted for inpatient treatment for surgery, pain control, PT, OT, prophylactic antibiotics, VTE prophylaxis, progressive ambulation and ADL's and discharge planning.The patient is planning to be discharged home with home health services

## 2023-06-12 NOTE — Anesthesia Preprocedure Evaluation (Signed)
 Anesthesia Evaluation  Patient identified by MRN, date of birth, ID band Patient awake    Reviewed: Allergy & Precautions, NPO status , Patient's Chart, lab work & pertinent test results  History of Anesthesia Complications Negative for: history of anesthetic complications  Airway Mallampati: II  TM Distance: >3 FB Neck ROM: Full    Dental  (+) Dental Advisory Given   Pulmonary neg pulmonary ROS   breath sounds clear to auscultation       Cardiovascular hypertension, Pt. on medications (-) angina  Rhythm:Regular Rate:Normal     Neuro/Psych    GI/Hepatic negative GI ROS, Neg liver ROS,,,  Endo/Other  BMI 31  Renal/GU negative Renal ROS     Musculoskeletal  (+) Arthritis ,    Abdominal   Peds  Hematology Hb 15.4, plt 418k   Anesthesia Other Findings   Reproductive/Obstetrics                              Anesthesia Physical Anesthesia Plan  ASA: 3  Anesthesia Plan: Spinal   Post-op Pain Management: Tylenol  PO (pre-op)*   Induction:   PONV Risk Score and Plan: 2 and Treatment may vary due to age or medical condition  Airway Management Planned: Natural Airway and Simple Face Mask  Additional Equipment: None  Intra-op Plan:   Post-operative Plan:   Informed Consent: I have reviewed the patients History and Physical, chart, labs and discussed the procedure including the risks, benefits and alternatives for the proposed anesthesia with the patient or authorized representative who has indicated his/her understanding and acceptance.     Dental advisory given  Plan Discussed with: CRNA and Surgeon  Anesthesia Plan Comments:          Anesthesia Quick Evaluation

## 2023-06-13 ENCOUNTER — Ambulatory Visit (HOSPITAL_COMMUNITY): Payer: Self-pay | Admitting: Anesthesiology

## 2023-06-13 ENCOUNTER — Ambulatory Visit (HOSPITAL_BASED_OUTPATIENT_CLINIC_OR_DEPARTMENT_OTHER): Payer: Self-pay | Admitting: Anesthesiology

## 2023-06-13 ENCOUNTER — Other Ambulatory Visit: Payer: Self-pay

## 2023-06-13 ENCOUNTER — Observation Stay (HOSPITAL_COMMUNITY)

## 2023-06-13 ENCOUNTER — Observation Stay (HOSPITAL_COMMUNITY)
Admission: RE | Admit: 2023-06-13 | Discharge: 2023-06-15 | Disposition: A | Discharge status: Home / Self Care | Attending: Orthopaedic Surgery | Admitting: Orthopaedic Surgery

## 2023-06-13 ENCOUNTER — Encounter (HOSPITAL_COMMUNITY): Payer: Self-pay | Admitting: Orthopaedic Surgery

## 2023-06-13 ENCOUNTER — Encounter (HOSPITAL_COMMUNITY)
Admission: RE | Disposition: A | Discharge status: Home / Self Care | Payer: Self-pay | Source: Home / Self Care | Attending: Orthopaedic Surgery

## 2023-06-13 ENCOUNTER — Ambulatory Visit (HOSPITAL_COMMUNITY)

## 2023-06-13 DIAGNOSIS — M1611 Unilateral primary osteoarthritis, right hip: Principal | ICD-10-CM | POA: Insufficient documentation

## 2023-06-13 DIAGNOSIS — Z96642 Presence of left artificial hip joint: Secondary | ICD-10-CM | POA: Diagnosis not present

## 2023-06-13 DIAGNOSIS — Z01818 Encounter for other preprocedural examination: Secondary | ICD-10-CM

## 2023-06-13 DIAGNOSIS — Z96641 Presence of right artificial hip joint: Secondary | ICD-10-CM

## 2023-06-13 DIAGNOSIS — I10 Essential (primary) hypertension: Secondary | ICD-10-CM | POA: Diagnosis not present

## 2023-06-13 DIAGNOSIS — Z79899 Other long term (current) drug therapy: Secondary | ICD-10-CM | POA: Diagnosis not present

## 2023-06-13 LAB — TYPE AND SCREEN
ABO/RH(D): O POS
Antibody Screen: NEGATIVE

## 2023-06-13 SURGERY — ARTHROPLASTY, HIP, TOTAL, ANTERIOR APPROACH
Anesthesia: Spinal | Site: Hip | Laterality: Right

## 2023-06-13 MED ORDER — 0.9 % SODIUM CHLORIDE (POUR BTL) OPTIME
TOPICAL | Status: DC | PRN
  Administered 2023-06-13: 1000 mL

## 2023-06-13 MED ORDER — ONDANSETRON HCL 4 MG/2ML IJ SOLN
INTRAMUSCULAR | Status: DC | PRN
Start: 2023-06-13 — End: 2023-06-13
  Administered 2023-06-13: 4 mg via INTRAVENOUS

## 2023-06-13 MED ORDER — SODIUM CHLORIDE 0.9 % IV SOLN: INTRAVENOUS | Status: DC

## 2023-06-13 MED ORDER — MENTHOL 3 MG MT LOZG: 1.0000 | LOZENGE | OROMUCOSAL | Status: DC | PRN

## 2023-06-13 MED ORDER — CEFAZOLIN SODIUM-DEXTROSE 2-4 GM/100ML-% IV SOLN
2.0000 g | Freq: Four times a day (QID) | INTRAVENOUS | Status: AC
  Administered 2023-06-13 (×2): 2 g via INTRAVENOUS
  Filled 2023-06-13 (×2): qty 100

## 2023-06-13 MED ORDER — OXYCODONE HCL 5 MG PO TABS
10.0000 mg | ORAL_TABLET | ORAL | Status: DC | PRN
  Administered 2023-06-14 (×2): 10 mg via ORAL
  Filled 2023-06-13 (×4): qty 2

## 2023-06-13 MED ORDER — POVIDONE-IODINE 10 % EX SWAB: 2.0000 | Freq: Once | CUTANEOUS | Status: DC

## 2023-06-13 MED ORDER — HYDROMORPHONE HCL 1 MG/ML IJ SOLN: 0.5000 mg | INTRAMUSCULAR | Status: DC | PRN

## 2023-06-13 MED ORDER — PHENOL 1.4 % MT LIQD
1.0000 | OROMUCOSAL | Status: DC | PRN
Start: 2023-06-13 — End: 2023-06-15

## 2023-06-13 MED ORDER — METHOCARBAMOL 500 MG PO TABS
500.0000 mg | ORAL_TABLET | Freq: Four times a day (QID) | ORAL | Status: DC | PRN
  Administered 2023-06-13 – 2023-06-14 (×4): 500 mg via ORAL
  Filled 2023-06-13 (×4): qty 1

## 2023-06-13 MED ORDER — SODIUM CHLORIDE 0.9 % IR SOLN
Status: DC | PRN
  Administered 2023-06-13: 1000 mL

## 2023-06-13 MED ORDER — OXYCODONE HCL 5 MG/5ML PO SOLN: 5.0000 mg | Freq: Once | ORAL | Status: DC | PRN

## 2023-06-13 MED ORDER — ASPIRIN 81 MG PO CHEW
81.0000 mg | CHEWABLE_TABLET | Freq: Two times a day (BID) | ORAL | Status: DC
  Administered 2023-06-13 – 2023-06-15 (×4): 81 mg via ORAL
  Filled 2023-06-13 (×4): qty 1

## 2023-06-13 MED ORDER — HYDROCHLOROTHIAZIDE 25 MG PO TABS
25.0000 mg | ORAL_TABLET | Freq: Every day | ORAL | Status: DC
  Administered 2023-06-14 – 2023-06-15 (×2): 25 mg via ORAL
  Filled 2023-06-13 (×2): qty 1

## 2023-06-13 MED ORDER — LACTATED RINGERS IV SOLN: INTRAVENOUS | Status: DC

## 2023-06-13 MED ORDER — PHENYLEPHRINE HCL (PRESSORS) 10 MG/ML IV SOLN
INTRAVENOUS | Status: DC | PRN
Start: 2023-06-13 — End: 2023-06-13
  Administered 2023-06-13 (×2): 160 ug via INTRAVENOUS
  Administered 2023-06-13: 80 ug via INTRAVENOUS

## 2023-06-13 MED ORDER — DOCUSATE SODIUM 100 MG PO CAPS
100.0000 mg | ORAL_CAPSULE | Freq: Two times a day (BID) | ORAL | Status: DC
  Administered 2023-06-13 – 2023-06-15 (×4): 100 mg via ORAL
  Filled 2023-06-13 (×4): qty 1

## 2023-06-13 MED ORDER — OXYCODONE HCL 5 MG PO TABS
5.0000 mg | ORAL_TABLET | ORAL | Status: DC | PRN
  Administered 2023-06-13: 10 mg via ORAL
  Administered 2023-06-13: 5 mg via ORAL
  Administered 2023-06-13 – 2023-06-14 (×2): 10 mg via ORAL
  Filled 2023-06-13: qty 2
  Filled 2023-06-13: qty 1

## 2023-06-13 MED ORDER — PROPOFOL 1000 MG/100ML IV EMUL
INTRAVENOUS | Status: AC
  Filled 2023-06-13: qty 100

## 2023-06-13 MED ORDER — PHENYLEPHRINE HCL-NACL 20-0.9 MG/250ML-% IV SOLN
INTRAVENOUS | Status: AC
  Filled 2023-06-13: qty 250

## 2023-06-13 MED ORDER — ALUM & MAG HYDROXIDE-SIMETH 200-200-20 MG/5ML PO SUSP: 30.0000 mL | ORAL | Status: DC | PRN

## 2023-06-13 MED ORDER — ONDANSETRON HCL 4 MG/2ML IJ SOLN
INTRAMUSCULAR | Status: AC
  Filled 2023-06-13: qty 2

## 2023-06-13 MED ORDER — MIDAZOLAM HCL 2 MG/2ML IJ SOLN: 0.5000 mg | Freq: Once | INTRAMUSCULAR | Status: DC | PRN

## 2023-06-13 MED ORDER — METOCLOPRAMIDE HCL 5 MG PO TABS: 5.0000 mg | ORAL_TABLET | Freq: Three times a day (TID) | ORAL | Status: DC | PRN

## 2023-06-13 MED ORDER — LIDOCAINE HCL (PF) 2 % IJ SOLN
INTRAMUSCULAR | Status: AC
  Filled 2023-06-13: qty 5

## 2023-06-13 MED ORDER — METHOCARBAMOL 1000 MG/10ML IJ SOLN
500.0000 mg | Freq: Four times a day (QID) | INTRAMUSCULAR | Status: DC | PRN
  Administered 2023-06-13: 500 mg via INTRAVENOUS
  Filled 2023-06-13: qty 10

## 2023-06-13 MED ORDER — EPHEDRINE SULFATE-NACL 50-0.9 MG/10ML-% IV SOSY
PREFILLED_SYRINGE | INTRAVENOUS | Status: DC | PRN
Start: 2023-06-13 — End: 2023-06-13
  Administered 2023-06-13: 5 mg via INTRAVENOUS

## 2023-06-13 MED ORDER — CEFAZOLIN SODIUM-DEXTROSE 2-4 GM/100ML-% IV SOLN
2.0000 g | INTRAVENOUS | Status: AC
  Administered 2023-06-13: 2 g via INTRAVENOUS
  Filled 2023-06-13: qty 100

## 2023-06-13 MED ORDER — CHLORHEXIDINE GLUCONATE 4 % EX SOLN: 1.0000 | CUTANEOUS | 1 refills | Status: DC

## 2023-06-13 MED ORDER — PROPOFOL 500 MG/50ML IV EMUL
INTRAVENOUS | Status: DC | PRN
  Administered 2023-06-13: 130 ug/kg/min via INTRAVENOUS
  Administered 2023-06-13: 20 mg via INTRAVENOUS
  Administered 2023-06-13: 50 mg via INTRAVENOUS

## 2023-06-13 MED ORDER — ACETAMINOPHEN 325 MG PO TABS
325.0000 mg | ORAL_TABLET | Freq: Four times a day (QID) | ORAL | Status: DC | PRN
  Administered 2023-06-13 – 2023-06-15 (×4): 650 mg via ORAL
  Filled 2023-06-13 (×4): qty 2

## 2023-06-13 MED ORDER — FENTANYL CITRATE (PF) 100 MCG/2ML IJ SOLN
INTRAMUSCULAR | Status: AC
  Filled 2023-06-13: qty 2

## 2023-06-13 MED ORDER — ORAL CARE MOUTH RINSE: 15.0000 mL | Freq: Once | OROMUCOSAL | Status: AC

## 2023-06-13 MED ORDER — ONDANSETRON HCL 4 MG PO TABS: 4.0000 mg | ORAL_TABLET | Freq: Four times a day (QID) | ORAL | Status: DC | PRN

## 2023-06-13 MED ORDER — ORAL CARE MOUTH RINSE: 15.0000 mL | OROMUCOSAL | Status: DC | PRN

## 2023-06-13 MED ORDER — MEPERIDINE HCL 50 MG/ML IJ SOLN: 6.2500 mg | INTRAMUSCULAR | Status: DC | PRN

## 2023-06-13 MED ORDER — ALBUMIN HUMAN 5 % IV SOLN
12.5000 g | Freq: Once | INTRAVENOUS | Status: AC
  Administered 2023-06-13: 12.5 g via INTRAVENOUS

## 2023-06-13 MED ORDER — HYDROMORPHONE HCL 1 MG/ML IJ SOLN: 0.2500 mg | INTRAMUSCULAR | Status: DC | PRN

## 2023-06-13 MED ORDER — DIPHENHYDRAMINE HCL 12.5 MG/5ML PO ELIX: 12.5000 mg | ORAL_SOLUTION | ORAL | Status: DC | PRN

## 2023-06-13 MED ORDER — BUPIVACAINE IN DEXTROSE 0.75-8.25 % IT SOLN
INTRATHECAL | Status: DC | PRN
Start: 2023-06-13 — End: 2023-06-13
  Administered 2023-06-13: 12 mg via INTRATHECAL

## 2023-06-13 MED ORDER — AMLODIPINE BESYLATE 5 MG PO TABS
5.0000 mg | ORAL_TABLET | Freq: Every day | ORAL | Status: DC
  Administered 2023-06-14 – 2023-06-15 (×2): 5 mg via ORAL
  Filled 2023-06-13 (×2): qty 1

## 2023-06-13 MED ORDER — MUPIROCIN 2 % EX OINT: 1.0000 | TOPICAL_OINTMENT | Freq: Two times a day (BID) | CUTANEOUS | 0 refills | Status: AC

## 2023-06-13 MED ORDER — ACETAMINOPHEN 500 MG PO TABS
1000.0000 mg | ORAL_TABLET | Freq: Once | ORAL | Status: AC
  Administered 2023-06-13: 1000 mg via ORAL
  Filled 2023-06-13: qty 2

## 2023-06-13 MED ORDER — PANTOPRAZOLE SODIUM 40 MG PO TBEC
40.0000 mg | DELAYED_RELEASE_TABLET | Freq: Every day | ORAL | Status: DC
  Administered 2023-06-13 – 2023-06-15 (×2): 40 mg via ORAL
  Filled 2023-06-13 (×2): qty 1

## 2023-06-13 MED ORDER — ONDANSETRON HCL 4 MG/2ML IJ SOLN
4.0000 mg | Freq: Four times a day (QID) | INTRAMUSCULAR | Status: DC | PRN
Start: 2023-06-13 — End: 2023-06-15

## 2023-06-13 MED ORDER — POTASSIUM CHLORIDE ER 10 MEQ PO TBCR
10.0000 meq | EXTENDED_RELEASE_TABLET | Freq: Every day | ORAL | Status: DC
  Administered 2023-06-14 – 2023-06-15 (×2): 10 meq via ORAL
  Filled 2023-06-13 (×4): qty 1

## 2023-06-13 MED ORDER — PHENYLEPHRINE HCL-NACL 20-0.9 MG/250ML-% IV SOLN
INTRAVENOUS | Status: DC | PRN
  Administered 2023-06-13: 40 ug/min via INTRAVENOUS

## 2023-06-13 MED ORDER — POLYETHYLENE GLYCOL 3350 17 G PO PACK
17.0000 g | PACK | Freq: Every day | ORAL | Status: DC | PRN
Start: 2023-06-13 — End: 2023-06-15

## 2023-06-13 MED ORDER — ALBUMIN HUMAN 5 % IV SOLN
INTRAVENOUS | Status: AC
Start: 2023-06-13 — End: ?
  Filled 2023-06-13: qty 250

## 2023-06-13 MED ORDER — STERILE WATER FOR IRRIGATION IR SOLN
Status: DC | PRN
  Administered 2023-06-13: 2000 mL

## 2023-06-13 MED ORDER — METOCLOPRAMIDE HCL 5 MG/ML IJ SOLN: 5.0000 mg | Freq: Three times a day (TID) | INTRAMUSCULAR | Status: DC | PRN

## 2023-06-13 MED ORDER — OXYCODONE HCL 5 MG PO TABS: 5.0000 mg | ORAL_TABLET | Freq: Once | ORAL | Status: DC | PRN

## 2023-06-13 MED ORDER — CHLORHEXIDINE GLUCONATE 0.12 % MT SOLN
15.0000 mL | Freq: Once | OROMUCOSAL | Status: AC
  Administered 2023-06-13: 15 mL via OROMUCOSAL

## 2023-06-13 MED ORDER — FENTANYL CITRATE (PF) 100 MCG/2ML IJ SOLN
INTRAMUSCULAR | Status: DC | PRN
  Administered 2023-06-13: 25 ug via INTRAVENOUS

## 2023-06-13 MED ORDER — TRANEXAMIC ACID-NACL 1000-0.7 MG/100ML-% IV SOLN
1000.0000 mg | INTRAVENOUS | Status: AC
  Administered 2023-06-13: 1000 mg via INTRAVENOUS
  Filled 2023-06-13: qty 100

## 2023-06-13 SURGICAL SUPPLY — 38 items
BAG COUNTER SPONGE SURGICOUNT (BAG) ×1 IMPLANT
BAG ZIPLOCK 12X15 (MISCELLANEOUS) IMPLANT
BALL HIP ARTICU EZE 36 8.5 (Hips) IMPLANT
BENZOIN TINCTURE PRP APPL 2/3 (GAUZE/BANDAGES/DRESSINGS) IMPLANT
BLADE SAW SGTL 18X1.27X75 (BLADE) ×1 IMPLANT
COVER PERINEAL POST (MISCELLANEOUS) ×1 IMPLANT
COVER SURGICAL LIGHT HANDLE (MISCELLANEOUS) ×1 IMPLANT
DRAPE C-ARM 42X120 X-RAY (DRAPES) IMPLANT
DRAPE FOOT SWITCH (DRAPES) ×1 IMPLANT
DRAPE STERI IOBAN 125X83 (DRAPES) ×1 IMPLANT
DRAPE U-SHAPE 47X51 STRL (DRAPES) ×2 IMPLANT
DRSG AQUACEL AG ADV 3.5X10 (GAUZE/BANDAGES/DRESSINGS) ×1 IMPLANT
DURAPREP 26ML APPLICATOR (WOUND CARE) ×1 IMPLANT
ELECT PENCIL ROCKER SW 15FT (MISCELLANEOUS) ×1 IMPLANT
ELECT REM PT RETURN 15FT ADLT (MISCELLANEOUS) ×1 IMPLANT
FEMORAL STEM 12/14 TPR SZ4 HIP (Orthopedic Implant) IMPLANT
GAUZE XEROFORM 1X8 LF (GAUZE/BANDAGES/DRESSINGS) IMPLANT
GLOVE BIO SURGEON STRL SZ7.5 (GLOVE) ×1 IMPLANT
GLOVE BIOGEL PI IND STRL 8 (GLOVE) ×2 IMPLANT
GLOVE ECLIPSE 8.0 STRL XLNG CF (GLOVE) ×1 IMPLANT
GOWN STRL REUS W/ TWL XL LVL3 (GOWN DISPOSABLE) ×2 IMPLANT
HEAD CERAMIC 36 PLUS5 (Hips) IMPLANT
HOLDER FOLEY CATH W/STRAP (MISCELLANEOUS) ×1 IMPLANT
KIT TURNOVER KIT A (KITS) IMPLANT
LINER NEUTRAL 52X36MM PLUS 4 (Liner) IMPLANT
PACK ANTERIOR HIP CUSTOM (KITS) ×1 IMPLANT
PIN SECTOR W/GRIP ACE CUP 52MM (Hips) IMPLANT
SET HNDPC FAN SPRY TIP SCT (DISPOSABLE) ×1 IMPLANT
STAPLER SKIN PROX 35W (STAPLE) IMPLANT
STRIP CLOSURE SKIN 1/2X4 (GAUZE/BANDAGES/DRESSINGS) IMPLANT
SUT ETHIBOND NAB CT1 #1 30IN (SUTURE) ×1 IMPLANT
SUT ETHILON 2 0 PS N (SUTURE) IMPLANT
SUT MNCRL AB 4-0 PS2 18 (SUTURE) IMPLANT
SUT VIC AB 0 CT1 36 (SUTURE) ×1 IMPLANT
SUT VIC AB 1 CT1 36 (SUTURE) ×1 IMPLANT
SUT VIC AB 2-0 CT1 TAPERPNT 27 (SUTURE) ×2 IMPLANT
TRAY FOLEY MTR SLVR 14FR STAT (SET/KITS/TRAYS/PACK) IMPLANT
YANKAUER SUCT BULB TIP NO VENT (SUCTIONS) ×1 IMPLANT

## 2023-06-13 NOTE — Anesthesia Procedure Notes (Signed)
 Spinal  Patient location during procedure: OR End time: 06/13/2023 7:26 AM Reason for block: surgical anesthesia Staffing Performed: anesthesiologist  Anesthesiologist: Jonne Netters, MD Performed by: Jonne Netters, MD Authorized by: Jonne Netters, MD   Preanesthetic Checklist Completed: patient identified, IV checked, site marked, risks and benefits discussed, surgical consent, monitors and equipment checked, pre-op evaluation and timeout performed Spinal Block Patient position: sitting Prep: DuraPrep Patient monitoring: heart rate, cardiac monitor, continuous pulse ox and blood pressure Approach: midline Location: L3-4 Injection technique: single-shot Needle Needle type: Pencan and Introducer  Needle gauge: 24 G Needle length: 9 cm Assessment Sensory level: T4 Events: CSF return Additional Notes Pt identified in Operating room.  Monitors applied. Working IV access confirmed. Sterile prep, drape lumbar spine.  1% lido local L 3,4.  #24ga Pencan into clear CSF L 3,4.  12mg  0.75% Bupivacaine  with dextrose  injected with asp CSF beginning and end of injection.  Patient asymptomatic, VSS, no heme aspirated, tolerated well.  Fay Hoop, MD

## 2023-06-13 NOTE — Op Note (Signed)
 Operative Note  Date of operation: 06/13/2023 Preoperative diagnosis: Right hip primary osteoarthritis Postoperative diagnosis: Same  Procedure: Right direct anterior total hip arthroplasty  Implants: DePuy sector GRIPTION SR component size 52, 36+4 polythene liner, size 4 Actis femoral component with high offset, size 36+8.5 metal ball  Surgeon: Jeanella Milan. Lucienne Ryder, MD Assistant: Malena Scull, PA-C  Anesthesia: Spinal EBL: 150 cc Antibiotics: 2 g IV Ancef  Complications: None  Indications: The patient is a pleasant 73 year old active female with bilateral hip severe degenerative changes and degenerative joint disease with arthritis.  We actually replaced her left hip successfully in early April of this year.  She is already ready to have her right hip replaced.  The left hip is done very well for her.  Having had this before she is fully aware of the risks of acute blood loss anemia, nerve vessel injury, fracture, infection, DVT, dislocation, implant failure, leg length differences and wound healing issues.  She understands that our goals are hopefully decreased pain, improved mobility and improved quality of life.  Procedure description: After informed consent was obtained and the appropriate right hip was marked, the patient was brought to the operating room and set up on the stretcher where spinal anesthesia was obtained.  She was then laid in a supine position on the stretcher and a Foley catheter was placed.  We were able to assess her leg lengths and although the x-rays look like she is longer she is actually shorter clinically on the right side than the left and we will compensate for that during radiographic and clinical assessment of the surgery.  The right operative hip was prepped and draped with DuraPrep and sterile drapes.  A timeout was called and she was benefits correct patient correct right hip.  An incision was then made just inferior and posterior to the ASIS and carried  slightly obliquely down the leg.  Dissection was carried down to the tensor fascia lata muscle and the tensor fascia was divided longitudinally to proceed with a direct approach to the hip.  Circumflex vessels were identified and cauterized.  The hip capsule was identified and opened up in L-type format finding a moderate joint effusion.  Cobra retractors were placed around the medial and lateral femoral neck and a femoral neck cut was made with an oscillating saw just proximal to the lesser trochanter.  This cut was completed with an osteotome.  A corkscrew guide was placed in the femoral head and the femoral head was removed in its entirety and there was a wide area of devoid of cartilage.  A bent Hohmann was then placed over the medial acetabular rim and remnants of the acetabular labrum and other debris were removed.  Reaming was then initiated from a size 43 reamer and stepwise increments going up to a size 51 reamer with all reamers placed under direct visualization and the last reamer also placed under direct fluoroscopy in order to obtain the depth of reaming, the inclination and the anteversion.  The real DePuy sector GRIPTION acetabular component size 52 was then placed without difficulty and we went with a 36+4 polyethylene liner.  Attention was then turned to the femur.  With the right leg externally rotated to 130 degrees, extended and adducted, a Mueller retractor was placed medially and Hohmann retractor behind the greater trochanter.  The lateral joint capsule was released and a box cutting osteotome was used in her femoral canal.  Broaching was then initiated using the Actis broaching system from a size  0 going up to a size 4.  Based off her anatomy and her other side we trialed a high offset femoral neck and a 36+1.5 trial hip ball.  The right leg was brought over and up and with traction and internal rotation reduced in the pelvis.  Based on clinical and radiographic assessment I felt like we  needed to go just a little bit longer.  Again compensating for the fact that she started shorter before the surgery.  We dislocated the hip and removed the trial components.  We then placed the real Actis femoral component size 4 with high offset and with a 36+5 ceramic head ball.  We reduce this in pelvis and radiographically we lengthen her just a little bit but clinically I just felt like I was not pleased with it being tight enough so we dislocated the hip and remove that 36+5 ceramic head ball and with a 36+8.5 metal head ball.  This reduced the pelvis and at that point I was pleased with stability of the hip to assess mechanically and radiographically and assessing the leg length and offset.  The soft tissue was then irrigated with normal saline solution.  The joint capsule was closed with interrupted #1 Ethibond suture followed by #1 Vicryl to close the tensor fascia.  0 Vicryl is used to close deep tissue and 2-0 Vicryl was used to close subcutaneous tissue.  The skin was closed with staples.  An Aquacel dressing was applied.  The patient was taken off the Hana table and taken the recovery room.  Malena Scull, PA-C did assist during the entire case and beginning and his assistance was crucial and medically necessary for soft tissue management and retraction, helping guide implant placement and a layered closure of the wound.

## 2023-06-13 NOTE — Anesthesia Postprocedure Evaluation (Addendum)
 Anesthesia Post Note  Patient: BERTINE SCHLOTTMAN  Procedure(s) Performed: ARTHROPLASTY, HIP, TOTAL, ANTERIOR APPROACH (Right: Hip)     Patient location during evaluation: PACU Anesthesia Type: Spinal Level of consciousness: oriented, patient cooperative and awake and alert Pain management: pain level controlled Vital Signs Assessment: post-procedure vital signs reviewed and stable Respiratory status: spontaneous breathing, nonlabored ventilation and respiratory function stable Cardiovascular status: blood pressure returned to baseline and stable Postop Assessment: no apparent nausea or vomiting, adequate PO intake, spinal receding and patient able to bend at knees Anesthetic complications: no   No notable events documented.  Last Vitals:  Vitals:   06/13/23 1100 06/13/23 1119  BP: 104/67 (!) 107/51  Pulse: 65 67  Resp: 20 18  Temp:  (!) 36.4 C  SpO2: 93% 96%    Last Pain:  Vitals:   06/13/23 1119  TempSrc: Oral  PainSc: 0-No pain                 Cherry Wittwer,E. Prudy Candy

## 2023-06-13 NOTE — Transfer of Care (Signed)
 Immediate Anesthesia Transfer of Care Note  Patient: Colleen Wiley  Procedure(s) Performed: ARTHROPLASTY, HIP, TOTAL, ANTERIOR APPROACH (Right: Hip)  Patient Location: PACU  Anesthesia Type:MAC and Spinal  Level of Consciousness: drowsy  Airway & Oxygen Therapy: Patient Spontanous Breathing and Patient connected to face mask  Post-op Assessment: Report given to RN  Post vital signs: Reviewed and stable  Last Vitals:  Vitals Value Taken Time  BP 113/75 06/13/23 0855  Temp    Pulse 96 06/13/23 0856  Resp 22 06/13/23 0856  SpO2 95 % 06/13/23 0856  Vitals shown include unfiled device data.  Last Pain:  Vitals:   06/13/23 0608  TempSrc: Oral  PainSc:          Complications: No notable events documented.

## 2023-06-13 NOTE — Evaluation (Signed)
 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  Pt is motivated and should progress to dc home with family assist.        If plan is discharge home, recommend the following: A little help with walking and/or transfers;A little help with bathing/dressing/bathroom;Assistance with cooking/housework;Assist for transportation;Help with stairs or ramp for entrance   Can travel by private vehicle        Equipment Recommendations None recommended by PT  Recommendations for Other Services       Functional Status Assessment Patient has had a recent decline in their functional status and demonstrates the ability to make significant improvements in function in a reasonable and predictable amount of time.     Precautions / Restrictions Precautions Precautions: Fall Restrictions Weight Bearing Restrictions Per Provider Order: No LLE Weight Bearing Per Provider Order: Weight bearing as tolerated      Mobility  Bed Mobility Overal bed mobility: Needs Assistance Bed Mobility: Supine to Sit, Sit to Supine     Supine to sit: Min assist Sit to supine: Min assist   General bed mobility comments: cues for sequence and use of L LE to self assist    Transfers Overall transfer level: Needs assistance Equipment used: Rolling walker (2 wheels) Transfers: Sit to/from Stand Sit to Stand: Min assist, Contact guard assist           General transfer comment: cues for LE management and use of UEs to self assist    Ambulation/Gait Ambulation/Gait assistance: Min assist, Contact guard assist Gait Distance (Feet): 75 Feet Assistive device: Rolling walker (2 wheels) Gait Pattern/deviations: Step-to pattern, Step-through  pattern, Decreased step length - right, Decreased step length - left, Shuffle, Trunk flexed Gait velocity: decr     General Gait Details: cues for posture, position from RW and initial sequence  Stairs            Wheelchair Mobility     Tilt Bed    Modified Rankin (Stroke Patients Only)       Balance Overall balance assessment: Needs assistance Sitting-balance support: No upper extremity supported, Feet supported Sitting balance-Leahy Scale: Good     Standing balance support: Bilateral upper extremity supported Standing balance-Leahy Scale: Poor                               Pertinent Vitals/Pain Pain Assessment Pain Assessment: 0-10 Pain Score: 4  Pain Location: R hip Pain Descriptors / Indicators: Aching, Sore Pain Intervention(s): Limited activity within patient's tolerance, Monitored during session, Premedicated before session, Ice applied    Home Living Family/patient expects to be discharged to:: Private residence Living Arrangements: Spouse/significant other Available Help at Discharge: Family;Available 24 hours/day Type of Home: House Home Access: Stairs to enter Entrance Stairs-Rails: Right Entrance Stairs-Number of Steps: 5   Home Layout: One level Home Equipment: Agricultural consultant (2 wheels);Cane - single point;BSC/3in1;Shower seat;Lift chair      Prior Function Prior Level of Function : Independent/Modified Independent             Mobility Comments: using rollator prior to admit       Extremity/Trunk Assessment   Upper Extremity Assessment Upper Extremity Assessment:  Overall [facility] for tasks assessed    Lower Extremity Assessment Lower Extremity Assessment: RLE deficits/detail    Cervical / Trunk Assessment Cervical / Trunk Assessment: Normal  Communication   Communication Communication: No apparent difficulties    Cognition Arousal: Alert Behavior During Therapy: WFL for tasks assessed/performed   PT - Cognitive  impairments: No apparent impairments                         Following commands: Intact       Cueing Cueing Techniques: Verbal cues     General Comments      Exercises     Assessment/Plan    PT Assessment Patient needs continued PT services  PT Problem List Decreased strength;Decreased range of motion;Decreased activity tolerance;Decreased balance;Decreased mobility;Decreased knowledge of use of DME;Pain       PT Treatment Interventions DME instruction;Gait training;Stair training;Functional mobility training;Therapeutic activities;Therapeutic exercise;Patient/family education    PT Goals (Current goals can be found in the Care Plan section)  Acute Rehab PT Goals Patient Stated Goal: Regain IND PT Goal Formulation: With patient Time For Goal Achievement: 06/20/23 Potential to Achieve Goals: Good    Frequency 7X/week     Co-evaluation               AM-PAC PT "6 Clicks" Mobility  Outcome Measure Help needed turning from your back to your side while in a flat bed without using bedrails?: A Little Help needed moving from lying on your back to sitting on the side of a flat bed without using bedrails?: A Little Help needed moving to and from a bed to a chair (including a wheelchair)?: A Little Help needed standing up from a chair using your arms (e.g., wheelchair or bedside chair)?: A Little Help needed to walk in hospital room?: A Little Help needed climbing 3-5 steps with a railing? : A Lot 6 Click Score: 17    End of Session Equipment Utilized During Treatment: Gait belt Activity Tolerance: Patient tolerated treatment well Patient left: in bed;with call bell/phone within reach;with bed alarm set Nurse Communication: Mobility status PT Visit Diagnosis: Difficulty in walking, not elsewhere classified (R26.2)    Time: 1914-7829 PT Time Calculation (min) (ACUTE ONLY): 18 min   Charges:   PT Evaluation $PT Eval Low Complexity: 1 Low   PT General  Charges $$ ACUTE PT VISIT: 1 Visit         Thedora Finlay PT Acute Rehabilitation Services Pager [phone] Office [phone]   Laraina Sulton 06/13/2023, 3:28 PM

## 2023-06-13 NOTE — Interval H&P Note (Signed)
 History and Physical Interval Note: The patient understands that she is here today for right total hip replacement to treat her severe right hip pain and arthritis.  There has been no acute or interval change in her medical status.  The risks and benefits of surgery have been discussed in detail and informed consent has been obtained.  The right operative hip has been marked.  06/13/2023 7:09 AM  Colleen Wiley  has presented today for surgery, with the diagnosis of osteoarthritis right hip.  The various methods of treatment have been discussed with the patient and family. After consideration of risks, benefits and other options for treatment, the patient has consented to  Procedure(s): ARTHROPLASTY, HIP, TOTAL, ANTERIOR APPROACH (Right) as a surgical intervention.  The patient's history has been reviewed, patient examined, no change in status, stable for surgery.  I have reviewed the patient's chart and labs.  Questions were answered to the patient's satisfaction.     Arnie Lao

## 2023-06-14 DIAGNOSIS — M1611 Unilateral primary osteoarthritis, right hip: Secondary | ICD-10-CM | POA: Diagnosis not present

## 2023-06-14 LAB — BASIC METABOLIC PANEL WITH GFR
Anion gap: 8 (ref 5–15)
BUN: 6 mg/dL — ABNORMAL LOW (ref 8–23)
CO2: 27 mmol/L (ref 22–32)
Calcium: 8.6 mg/dL — ABNORMAL LOW (ref 8.9–10.3)
Chloride: 100 mmol/L (ref 98–111)
Creatinine, Ser: 0.49 mg/dL (ref 0.44–1.00)
GFR, Estimated: >60 mL/min (ref >60)
Glucose, Bld: 125 mg/dL — ABNORMAL HIGH (ref 70–99)
Potassium: 3 mmol/L — ABNORMAL LOW (ref 3.5–5.1)
Sodium: 135 mmol/L (ref 135–145)

## 2023-06-14 LAB — CBC
HCT: 38.7 % (ref 36.0–46.0)
Hemoglobin: 12.7 g/dL (ref 12.0–15.0)
MCH: 30.8 pg (ref 26.0–34.0)
MCHC: 32.8 g/dL (ref 30.0–36.0)
MCV: 93.7 fL (ref 80.0–100.0)
Platelets: 334 10*3/uL (ref 150–400)
RBC: 4.13 MIL/uL (ref 3.87–5.11)
RDW: 12.1 % (ref 11.5–15.5)
WBC: 14.8 10*3/uL — ABNORMAL HIGH (ref 4.0–10.5)
nRBC: 0 % (ref 0.0–0.2)

## 2023-06-14 MED ORDER — ASPIRIN 81 MG PO CHEW: 81.0000 mg | CHEWABLE_TABLET | Freq: Two times a day (BID) | ORAL | 0 refills | Status: DC

## 2023-06-14 MED ORDER — HYDROCODONE-ACETAMINOPHEN 7.5-325 MG PO TABS: 1.0000 | ORAL_TABLET | Freq: Four times a day (QID) | ORAL | 0 refills | Status: DC | PRN

## 2023-06-14 MED ORDER — METHOCARBAMOL 500 MG PO TABS: 500.0000 mg | ORAL_TABLET | Freq: Four times a day (QID) | ORAL | 1 refills | Status: DC | PRN

## 2023-06-14 MED ORDER — POTASSIUM CHLORIDE 20 MEQ PO PACK: 20.0000 meq | PACK | Freq: Once | ORAL | Status: DC

## 2023-06-14 MED ORDER — HYDROCODONE-ACETAMINOPHEN 7.5-325 MG PO TABS: 1.0000 | ORAL_TABLET | Freq: Four times a day (QID) | ORAL | Status: DC | PRN

## 2023-06-14 MED ORDER — POTASSIUM CHLORIDE CRYS ER 20 MEQ PO TBCR
20.0000 meq | EXTENDED_RELEASE_TABLET | Freq: Once | ORAL | Status: AC
  Administered 2023-06-14: 20 meq via ORAL
  Filled 2023-06-14: qty 1

## 2023-06-14 MED ORDER — SODIUM CHLORIDE 0.9 % IV BOLUS
500.0000 mL | Freq: Once | INTRAVENOUS | Status: AC
  Administered 2023-06-14: 500 mL via INTRAVENOUS

## 2023-06-14 NOTE — Progress Notes (Signed)
 Patient mentation and vital signs have improved after the bolus.  Discharge delayed until tomorrow.

## 2023-06-14 NOTE — Progress Notes (Signed)
 Patient desats to 88% during sleep. Placed on 1 liter nasal canula with resulting 97% oxygen saturation.

## 2023-06-14 NOTE — Progress Notes (Signed)
 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  Pt up to bathroom for toileting with hand hygiene at sink and then up to recliner upon arrival of bfast.  Will follow up with stair training etc following next pain meds.    If plan is discharge home, recommend the following: A little help with walking and/or transfers;A little help with bathing/dressing/bathroom;Assistance with cooking/housework;Assist for transportation;Help with stairs or ramp for entrance   Can travel by private vehicle        Equipment Recommendations  None recommended by PT    Recommendations for Other Services       Precautions / Restrictions Precautions Precautions: Fall Restrictions Weight Bearing Restrictions Per Provider Order: No LLE Weight Bearing Per Provider Order: Weight bearing as tolerated     Mobility  Bed Mobility Overal bed mobility: Needs Assistance Bed Mobility: Supine to Sit     Supine to sit: Contact guard     General bed mobility comments: cues for sequence and use of L LE to self assist    Transfers Overall transfer level: Needs assistance Equipment used: Rolling walker (2 wheels) Transfers: Sit to/from Stand Sit to Stand: Contact guard assist, Supervision                Ambulation/Gait Ambulation/Gait assistance: Contact guard assist Gait Distance (Feet): 30 Feet (15' x 2 to from bathroom) Assistive device: Rolling walker (2 wheels) Gait Pattern/deviations: Step-to pattern, Step-through pattern, Decreased step length - right, Decreased step length - left, Shuffle, Trunk flexed Gait velocity: decr     General Gait Details: cues for posture, position from RW and initial sequence   Stairs             Wheelchair Mobility     Tilt  Bed    Modified Rankin (Stroke Patients Only)       Balance Overall balance assessment: Needs assistance Sitting-balance support: No upper extremity supported, Feet supported Sitting balance-Leahy Scale: Good     Standing balance support: Bilateral upper extremity supported Standing balance-Leahy Scale: Fair                              Hotel manager: No apparent difficulties  Cognition Arousal: Alert Behavior During Therapy: WFL for tasks assessed/performed   PT - Cognitive impairments: No apparent impairments                         Following commands: Intact      Cueing Cueing Techniques: Verbal cues  Exercises Total Joint Exercises Ankle Circles/Pumps: AROM, Both, 15 reps, Supine    General Comments        Pertinent Vitals/Pain Pain Assessment Pain Assessment: 0-10 Pain Score: 4  Pain Location: R hip Pain Descriptors / Indicators: Aching, Sore Pain Intervention(s): Limited activity within patient's tolerance, Monitored during session, Premedicated before session, Ice applied    Home Living                          Prior Function            PT Goals (current goals can now be found in the care plan section) Acute Rehab  PT Goals Patient Stated Goal: Regain IND PT Goal Formulation: With patient Time For Goal Achievement: 06/20/23 Potential to Achieve Goals: Good Progress towards PT goals: Progressing toward goals    Frequency    7X/week      PT Plan      Co-evaluation              AM-PAC PT "6 Clicks" Mobility   Outcome Measure  Help needed turning from your back to your side while in a flat bed without using bedrails?: A Little Help needed moving from lying on your back to sitting on the side of a flat bed without using bedrails?: A Little Help needed moving to and from a bed to a chair (including a wheelchair)?: A Little Help needed standing up from a chair using your  arms (e.g., wheelchair or bedside chair)?: A Little Help needed to walk in hospital room?: A Little Help needed climbing 3-5 steps with a railing? : A Lot 6 Click Score: 17    End of Session Equipment Utilized During Treatment: Gait belt Activity Tolerance: Patient tolerated treatment well Patient left: in chair;with call bell/phone within reach;with chair alarm set Nurse Communication: Mobility status PT Visit Diagnosis: Difficulty in walking, not elsewhere classified (R26.2)     Time: 1610-9604 PT Time Calculation (min) (ACUTE ONLY): 20 min  Charges:    $Therapeutic Activity: 8-22 mins PT General Charges $$ ACUTE PT VISIT: 1 Visit                     Thedora Finlay PT Acute Rehabilitation Services Pager [phone] Office [phone]    Emberleigh Reily 06/14/2023, 1:11 PM

## 2023-06-14 NOTE — Plan of Care (Signed)
  Problem: Health Behavior/Discharge Planning: Goal: Ability to manage health-related needs will improve Outcome: Progressing   Problem: Clinical Measurements: Goal: Ability to maintain clinical measurements within normal limits will improve Outcome: Progressing Goal: Will remain free from infection Outcome: Progressing Goal: Diagnostic test results will improve Outcome: Progressing Goal: Respiratory complications will improve Outcome: Progressing Goal: Cardiovascular complication will be avoided Outcome: Progressing   Problem: Activity: Goal: Risk for activity intolerance will decrease Outcome: Progressing   Problem: Nutrition: Goal: Adequate nutrition will be maintained Outcome: Progressing   Problem: Coping: Goal: Level of anxiety will decrease Outcome: Progressing   Problem: Elimination: Goal: Will not experience complications related to bowel motility Outcome: Progressing Goal: Will not experience complications related to urinary retention Outcome: Progressing   Problem: Pain Managment: Goal: General experience of comfort will improve and/or be controlled Outcome: Progressing   Problem: Safety: Goal: Ability to remain free from injury will improve Outcome: Progressing   Problem: Skin Integrity: Goal: Risk for impaired skin integrity will decrease Outcome: Progressing   Problem: Education: Goal: Knowledge of the prescribed therapeutic regimen will improve Outcome: Progressing Goal: Understanding of discharge needs will improve Outcome: Progressing   Problem: Activity: Goal: Ability to avoid complications of mobility impairment will improve Outcome: Progressing Goal: Ability to tolerate increased activity will improve Outcome: Progressing   Problem: Clinical Measurements: Goal: Postoperative complications will be avoided or minimized Outcome: Progressing   Problem: Pain Management: Goal: Pain level will decrease with appropriate  interventions Outcome: Progressing   Problem: Skin Integrity: Goal: Will show signs of wound healing Outcome: Progressing

## 2023-06-14 NOTE — Progress Notes (Signed)
 Patient noted to be very drowsy, room air saturation in the 80s and disoriented.  Malena Scull and ICU nurse [facility] notified.  NS bolus ordered.  Oxygen on at 3L [place].  Pain med changed from oxy to norco.

## 2023-06-14 NOTE — Progress Notes (Signed)
 Subjective: 1 Day Post-Op Procedure(s) (LRB): ARTHROPLASTY, HIP, TOTAL, ANTERIOR APPROACH (Right) Patient reports pain as moderate.  No chest pain or SOB. Tolerating diet well. Appears somewhat sleepy.   Objective: Vital signs in last 24 hours: Temp:  [97.4 F (36.3 C)-99.9 F (37.7 C)] 99.8 F (37.7 C) (05/31 0555) Pulse Rate:  [61-98] 98 (05/31 0555) Resp:  [12-22] 16 (05/31 0555) BP: (89-144)/(51-94) 109/73 (05/31 0555) SpO2:  [88 %-100 %] 95 % (05/31 0555)  Intake/Output from previous day: 05/30 0701 - 05/31 0700 In: 2695.2 [P.O.:360; I.V.:1785.2; IV Piggyback:550] Out: 2450 [Urine:2300; Blood:150] Intake/Output this shift: No intake/output data recorded.  Recent Labs    06/14/23 0341  HGB 12.7   Recent Labs    06/14/23 0341  WBC 14.8*  RBC 4.13  HCT 38.7  PLT 334   Recent Labs    06/14/23 0341  NA 135  K 3.0*  CL 100  CO2 27  BUN 6*  CREATININE 0.49  GLUCOSE 125*  CALCIUM 8.6*   No results for input(s): "LABPT", "INR" in the last 72 hours.  Intact pulses distally Dorsiflexion/Plantar flexion intact Incision: dressing C/D/I Compartment soft   Assessment/Plan: 1 Day Post-Op Procedure(s) (LRB): ARTHROPLASTY, HIP, TOTAL, ANTERIOR APPROACH (Right) Up with therapy Monitor oxygen saturation.  Possible discharge to home if remains stable.  Encourage incentive spirometry.     Colleen Wiley 06/14/2023, 9:47 AM

## 2023-06-14 NOTE — Significant Event (Signed)
 Rapid Response Event Note   Reason for Call : New onset of disoreination.    Initial Focused Assessment:  Patient alert and oriented x 3. To self, situation and time.  Vital signs stable, however patinet is now requiring oxygen @ 2 liters.    1345: Patient is alert and oriented x 4.    Interventions:  Primary Nurse notified Malena Scull.  Bolus ordered Pain medications changed   Plan of Care:  Pulmonary toilet IS   Serita Danes Lavan Imes, RN

## 2023-06-14 NOTE — Plan of Care (Signed)
 Per hand off report from day shift RN, Colleen Wiley woke up disoriented after receiving consecutive Q4H doses of oxycodone .  Mentation has returned to baseline but will continue to observe overnight.

## 2023-06-14 NOTE — Care Management Obs Status (Signed)
 MEDICARE OBSERVATION STATUS NOTIFICATION   Patient Details  Name: Colleen Wiley MRN: [id number] Date of Birth: 03-06-1950   Medicare Observation Status Notification Given:  Yes    Amaryllis Junior, LCSW 06/14/2023, 1:00 PM

## 2023-06-14 NOTE — Progress Notes (Signed)
 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  Spouse present and participating. Pt hopeful for dc home this date but RN notes issues with O2 sats.    If plan is discharge home, recommend the following: A little help with walking and/or transfers;A little help with bathing/dressing/bathroom;Assistance with cooking/housework;Assist for transportation;Help with stairs or ramp for entrance   Can travel by private vehicle        Equipment Recommendations  None recommended by PT    Recommendations for Other Services       Precautions / Restrictions Precautions Precautions: Fall Restrictions Weight Bearing Restrictions Per Provider Order: No LLE Weight Bearing Per Provider Order: Weight bearing as tolerated     Mobility  Bed Mobility Overal bed mobility: Needs Assistance Bed Mobility: Supine to Sit     Supine to sit: Contact guard     General bed mobility comments: Pt up in recliner and returns to same    Transfers Overall transfer level: Needs assistance Equipment used: Rolling walker (2 wheels) Transfers: Sit to/from Stand Sit to Stand: Supervision           General transfer comment: cues for LE management and use of UEs to self assist    Ambulation/Gait Ambulation/Gait assistance: Contact guard assist, Supervision Gait Distance (Feet): 100 Feet Assistive device: Rolling walker (2 wheels) Gait Pattern/deviations: Step-to pattern, Step-through pattern, Decreased step length - right, Decreased step length - left, Shuffle, Trunk flexed Gait velocity: decr     General Gait Details: cues for posture, position from RW and initial sequence   Stairs Stairs: Yes Stairs assistance: Min assist Stair  Management: One rail Left, Step to pattern, Forwards, With cane Number of Stairs: 5 General stair comments: 2+3 stairs with cues for sequence and foot/cane placement; spouse present; written instruction provided   Wheelchair Mobility     Tilt Bed    Modified Rankin (Stroke Patients Only)       Balance Overall balance assessment: Needs assistance Sitting-balance support: No upper extremity supported, Feet supported Sitting balance-Leahy Scale: Good     Standing balance support: No upper extremity supported Standing balance-Leahy Scale: Fair                              Hotel manager: No apparent difficulties  Cognition Arousal: Alert Behavior During Therapy: WFL for tasks assessed/performed   PT - Cognitive impairments: No apparent impairments                         Following commands: Intact      Cueing Cueing Techniques: Verbal cues  Exercises Total Joint Exercises Ankle Circles/Pumps: AROM, Both, 15 reps, Supine    General Comments        Pertinent Vitals/Pain Pain Assessment Pain Assessment: 0-10 Pain Score: 4  Pain Location: R hip Pain Descriptors / Indicators: Aching, Sore Pain Intervention(s): Limited activity within patient's tolerance, Monitored during session, Premedicated before session, Ice applied    Home Living                          Prior Function  PT Goals (current goals can now be found in the care plan section) Acute Rehab PT Goals Patient Stated Goal: Regain IND PT Goal Formulation: With patient Time For Goal Achievement: 06/20/23 Potential to Achieve Goals: Good Progress towards PT goals: Progressing toward goals    Frequency    7X/week      PT Plan      Co-evaluation              AM-PAC PT "6 Clicks" Mobility   Outcome Measure  Help needed turning from your back to your side while in a flat bed without using bedrails?: A Little Help  needed moving from lying on your back to sitting on the side of a flat bed without using bedrails?: A Little Help needed moving to and from a bed to a chair (including a wheelchair)?: A Little Help needed standing up from a chair using your arms (e.g., wheelchair or bedside chair)?: A Little Help needed to walk in hospital room?: A Little Help needed climbing 3-5 steps with a railing? : A Little 6 Click Score: 18    End of Session Equipment Utilized During Treatment: Gait belt Activity Tolerance: Patient tolerated treatment well Patient left: in chair;with call bell/phone within reach;with chair alarm set Nurse Communication: Mobility status PT Visit Diagnosis: Difficulty in walking, not elsewhere classified (R26.2)     Time: 9562-1308 PT Time Calculation (min) (ACUTE ONLY): 27 min  Charges:    $Gait Training: 8-22 mins $Therapeutic Activity: 8-22 mins PT General Charges $$ ACUTE PT VISIT: 1 Visit                     Thedora Finlay PT Acute Rehabilitation Services Pager [phone] Office [phone]    Quante Pettry 06/14/2023, 1:16 PM

## 2023-06-14 NOTE — Discharge Instructions (Signed)

## 2023-06-14 NOTE — Discharge Summary (Signed)
 Patient ID: Colleen Wiley MRN: [id number] DOB/AGE: 1950-11-26 73 y.o.  Admit date: 06/13/2023 Discharge date: 06/14/2023  Admission Diagnoses:  Principal Problem:   Unilateral primary osteoarthritis, right hip Active Problems:   Status post total replacement of right hip   Discharge Diagnoses:  Same  Past Medical History:  Diagnosis Date   Arthritis    Hypertension     Surgeries: Procedure(s): ARTHROPLASTY, HIP, TOTAL, ANTERIOR APPROACH on 06/13/2023   Consultants:   Discharged Condition: Improved  Hospital Course: AUSTYNN PRIDMORE is an 73 y.o. female who was admitted 06/13/2023 for operative treatment ofUnilateral primary osteoarthritis, right hip. Patient has severe unremitting pain that affects sleep, daily activities, and work/hobbies. After pre-op clearance the patient was taken to the operating room on 06/13/2023 and underwent  Procedure(s): ARTHROPLASTY, HIP, TOTAL, ANTERIOR APPROACH.    Patient was given perioperative antibiotics:  Anti-infectives (From admission, onward)    Start     Dose/Rate Route Frequency Ordered Stop   06/13/23 1400  ceFAZolin  (ANCEF ) IVPB 2g/100 mL premix        2 g 200 mL/hr over 30 Minutes Intravenous Every 6 hours 06/13/23 1115 06/13/23 2027   06/13/23 0600  ceFAZolin  (ANCEF ) IVPB 2g/100 mL premix        2 g 200 mL/hr over 30 Minutes Intravenous On call to O.R. 06/13/23 0539 06/13/23 0740        Patient was given sequential compression devices, early ambulation, and chemoprophylaxis to prevent DVT.  Patient benefited maximally from hospital stay and there were no complications.    Recent vital signs: Patient Vitals for the past 24 hrs:  BP Temp Temp src Pulse Resp SpO2  06/14/23 0955 122/86 -- -- -- -- 95 %  06/14/23 0555 109/73 99.8 F (37.7 C) Oral 98 16 95 %  06/14/23 0121 128/72 99.9 F (37.7 C) Oral 94 16 97 %  06/14/23 0118 -- -- -- -- -- 97 %  06/14/23 0115 -- -- -- -- -- (!) 88 %  06/13/23 2040 (!) 144/94 98.7 F  (37.1 C) Oral 95 16 94 %  06/13/23 1753 129/79 98.1 F (36.7 C) Oral 97 18 95 %  06/13/23 1335 101/62 98.1 F (36.7 C) Oral 88 18 97 %  06/13/23 1119 (!) 107/51 (!) 97.5 F (36.4 C) Oral 67 18 96 %     Recent laboratory studies:  Recent Labs    06/14/23 0341  WBC 14.8*  HGB 12.7  HCT 38.7  PLT 334  NA 135  K 3.0*  CL 100  CO2 27  BUN 6*  CREATININE 0.49  GLUCOSE 125*  CALCIUM 8.6*     Discharge Medications:   Allergies as of 06/14/2023   No Known Allergies      Medication List     TAKE these medications    amLODipine  5 MG tablet Commonly known as: NORVASC  Take 5 mg by mouth in the morning.   aspirin  81 MG chewable tablet Chew 1 tablet (81 mg total) by mouth 2 (two) times daily.   chlorhexidine  4 % external liquid Commonly known as: HIBICLENS  Apply 15 mLs (1 Application total) topically as directed for 30 doses. Use as directed daily for 5 days every other week for 6 weeks.   hydrochlorothiazide  25 MG tablet Commonly known as: HYDRODIURIL  Take 25 mg by mouth in the morning.   HYDROcodone-acetaminophen  7.5-325 MG tablet Commonly known as: NORCO Take 1 tablet by mouth every 6 (six) hours as needed for moderate pain (pain score 4-6).  methocarbamol  500 MG tablet Commonly known as: ROBAXIN  Take 1 tablet (500 mg total) by mouth every 6 (six) hours as needed for muscle spasms.   mupirocin  ointment 2 % Commonly known as: BACTROBAN  Place 1 Application into the nose 2 (two) times daily for 60 doses. Use as directed 2 times daily for 5 days every other week for 6 weeks.   potassium chloride  10 MEQ tablet Commonly known as: KLOR-CON  Take 10 mEq by mouth in the morning.   VITAMIN D3 PO Take 1 tablet by mouth 4 (four) times a week. Chewable               Durable Medical Equipment  (From admission, onward)           Start     Ordered   06/13/23 1116  DME 3 n 1  Once        06/13/23 1115   06/13/23 1116  DME Walker rolling  Once        Question Answer Comment  Walker: With 5 Inch Wheels   Patient needs a walker to treat with the following condition Status post total replacement of right hip      06/13/23 1115            Diagnostic Studies: DG Pelvis Portable Result Date: 06/13/2023 CLINICAL DATA:  Status post right hip arthroplasty. EXAM: PORTABLE PELVIS 1-2 VIEWS COMPARISON:  04/18/2023 FINDINGS: Right hip arthroplasty in expected alignment. No periprosthetic lucency or fracture. Recent postsurgical change includes air and edema in the soft tissues. Lateral skin staples in place. Previous left hip arthroplasty. IMPRESSION: Right hip arthroplasty without immediate postoperative complication. Electronically Signed   By: Chadwick Colonel M.D.   On: 06/13/2023 11:19   DG HIP UNILAT WITH PELVIS 1V RIGHT Result Date: 06/13/2023 CLINICAL DATA:  Elective surgery EXAM: DG HIP (WITH OR WITHOUT PELVIS) 1V RIGHT COMPARISON:  None Available. FINDINGS: Three fluoroscopic spot views of the pelvis and right hip obtained in the operating room. Images during hip arthroplasty. Fluoroscopy time 15 seconds. Dose 2.3644 mGy. IMPRESSION: Intraoperative fluoroscopy during right hip arthroplasty. Electronically Signed   By: Chadwick Colonel M.D.   On: 06/13/2023 11:19   DG C-Arm 1-60 Min-No Report Result Date: 06/13/2023 Fluoroscopy was utilized by the requesting physician.  No radiographic interpretation.    Disposition: Discharge disposition: 06-Home-Health Care Svc          Follow-up Information     Arnie Lao, MD Follow up in 2 week(s).   Specialty: Orthopedic Surgery Contact information: [address] [place]  [place] [address] [phone]                  Signed: Gomez Lathe 06/14/2023, 11:01 AM

## 2023-06-15 DIAGNOSIS — M1611 Unilateral primary osteoarthritis, right hip: Secondary | ICD-10-CM | POA: Diagnosis not present

## 2023-06-15 LAB — CBC
HCT: 38.3 % (ref 36.0–46.0)
Hemoglobin: 12.4 g/dL (ref 12.0–15.0)
MCH: 29.8 pg (ref 26.0–34.0)
MCHC: 32.4 g/dL (ref 30.0–36.0)
MCV: 92.1 fL (ref 80.0–100.0)
Platelets: 291 10*3/uL (ref 150–400)
RBC: 4.16 MIL/uL (ref 3.87–5.11)
RDW: 12.2 % (ref 11.5–15.5)
WBC: 15.8 10*3/uL — ABNORMAL HIGH (ref 4.0–10.5)
nRBC: 0 % (ref 0.0–0.2)

## 2023-06-15 NOTE — Plan of Care (Signed)
  Problem: Health Behavior/Discharge Planning: Goal: Ability to manage health-related needs will improve Outcome: Adequate for Discharge   Problem: Clinical Measurements: Goal: Ability to maintain clinical measurements within normal limits will improve Outcome: Adequate for Discharge Goal: Will remain free from infection Outcome: Adequate for Discharge Goal: Diagnostic test results will improve Outcome: Adequate for Discharge Goal: Respiratory complications will improve Outcome: Adequate for Discharge Goal: Cardiovascular complication will be avoided Outcome: Adequate for Discharge   Problem: Activity: Goal: Risk for activity intolerance will decrease Outcome: Adequate for Discharge   Problem: Nutrition: Goal: Adequate nutrition will be maintained Outcome: Adequate for Discharge   Problem: Coping: Goal: Level of anxiety will decrease Outcome: Adequate for Discharge   Problem: Elimination: Goal: Will not experience complications related to bowel motility Outcome: Adequate for Discharge Goal: Will not experience complications related to urinary retention Outcome: Adequate for Discharge   Problem: Pain Managment: Goal: General experience of comfort will improve and/or be controlled Outcome: Adequate for Discharge   Problem: Safety: Goal: Ability to remain free from injury will improve Outcome: Adequate for Discharge   Problem: Skin Integrity: Goal: Risk for impaired skin integrity will decrease Outcome: Adequate for Discharge   Problem: Education: Goal: Knowledge of the prescribed therapeutic regimen will improve Outcome: Adequate for Discharge Goal: Understanding of discharge needs will improve Outcome: Adequate for Discharge   Problem: Activity: Goal: Ability to avoid complications of mobility impairment will improve Outcome: Adequate for Discharge Goal: Ability to tolerate increased activity will improve Outcome: Adequate for Discharge   Problem: Clinical  Measurements: Goal: Postoperative complications will be avoided or minimized Outcome: Adequate for Discharge   Problem: Pain Management: Goal: Pain level will decrease with appropriate interventions Outcome: Adequate for Discharge   Problem: Skin Integrity: Goal: Will show signs of wound healing Outcome: Adequate for Discharge

## 2023-06-15 NOTE — Discharge Summary (Signed)
 Physician Discharge Summary  Patient ID: Colleen Wiley MRN: [id number] DOB/AGE: [birth year] 73 y.o.  Admit date: 06/13/2023 Discharge date: 06/15/2023  Admission Diagnoses:  Principal Problem:   Unilateral primary osteoarthritis, right hip Active Problems:   Status post total replacement of right hip   Discharge Diagnoses:  Same  Past Medical History:  Diagnosis Date   Arthritis    Hypertension     Surgeries: Procedure(s): ARTHROPLASTY, HIP, TOTAL, ANTERIOR APPROACH on 06/13/2023   Consultants:   Discharged Condition: Improved  Hospital Course: Colleen Wiley is an 73 y.o. female who was admitted 06/13/2023 with a chief complaint of No chief complaint on file. , and found to have a diagnosis of Unilateral primary osteoarthritis, right hip.  They were brought to the operating room on 06/13/2023 and underwent the above named procedures.    They were given perioperative antibiotics:  Anti-infectives (From admission, onward)    Start     Dose/Rate Route Frequency Ordered Stop   06/13/23 1400  ceFAZolin  (ANCEF ) IVPB 2g/100 mL premix        2 g 200 mL/hr over 30 Minutes Intravenous Every 6 hours 06/13/23 1115 06/13/23 2127   06/13/23 0600  ceFAZolin  (ANCEF ) IVPB 2g/100 mL premix        2 g 200 mL/hr over 30 Minutes Intravenous On call to O.R. 06/13/23 4098 06/13/23 0740     .  They were given compression stockings, early ambulation, and chemoprophylaxis for DVT prophylaxis.  They benefited maximally from their hospital stay and there were no complications.    Recent vital signs:  Vitals:   06/14/23 2154 06/15/23 0657  BP: (!) 150/77 (P) 120/68  Pulse: (!) 109 (!) (P) 110  Resp: 16 (P) 16  Temp: 99.1 F (37.3 C) (P) 98.9 F (37.2 C)  SpO2: 92% (P) 94%    Recent laboratory studies:  Results for orders placed or performed during the hospital encounter of 06/13/23  CBC   Collection Time: 06/14/23  3:41 AM  Result Value Ref Range   WBC 14.8 (H) 4.0 - 10.5 K/uL    RBC 4.13 3.87 - 5.11 MIL/uL   Hemoglobin 12.7 12.0 - 15.0 g/dL   HCT 11.9 14.7 - 82.9 %   MCV 93.7 80.0 - 100.0 fL   MCH 30.8 26.0 - 34.0 pg   MCHC 32.8 30.0 - 36.0 g/dL   RDW 56.2 13.0 - 86.5 %   Platelets 334 150 - 400 K/uL   nRBC 0.0 0.0 - 0.2 %  Basic metabolic panel   Collection Time: 06/14/23  3:41 AM  Result Value Ref Range   Sodium 135 135 - 145 mmol/L   Potassium 3.0 (L) 3.5 - 5.1 mmol/L   Chloride 100 98 - 111 mmol/L   CO2 27 22 - 32 mmol/L   Glucose, Bld 125 (H) 70 - 99 mg/dL   BUN 6 (L) 8 - 23 mg/dL   Creatinine, Ser 7.84 0.44 - 1.00 mg/dL   Calcium 8.6 (L) 8.9 - 10.3 mg/dL   GFR, Estimated >69 >62 mL/min   Anion gap 8 5 - 15  CBC   Collection Time: 06/15/23  3:10 AM  Result Value Ref Range   WBC 15.8 (H) 4.0 - 10.5 K/uL   RBC 4.16 3.87 - 5.11 MIL/uL   Hemoglobin 12.4 12.0 - 15.0 g/dL   HCT 95.2 84.1 - 32.4 %   MCV 92.1 80.0 - 100.0 fL   MCH 29.8 26.0 - 34.0 pg   MCHC 32.4 30.0 -  36.0 g/dL   RDW 54.0 98.1 - 19.1 %   Platelets 291 150 - 400 K/uL   nRBC 0.0 0.0 - 0.2 %    Discharge Medications:   Allergies as of 06/15/2023   No Known Allergies      Medication List     TAKE these medications    amLODipine  5 MG tablet Commonly known as: NORVASC  Take 5 mg by mouth in the morning.   aspirin  81 MG chewable tablet Chew 1 tablet (81 mg total) by mouth 2 (two) times daily.   chlorhexidine  4 % external liquid Commonly known as: HIBICLENS  Apply 15 mLs (1 Application total) topically as directed for 30 doses. Use as directed daily for 5 days every other week for 6 weeks.   hydrochlorothiazide  25 MG tablet Commonly known as: HYDRODIURIL  Take 25 mg by mouth in the morning.   HYDROcodone-acetaminophen  7.5-325 MG tablet Commonly known as: NORCO Take 1 tablet by mouth every 6 (six) hours as needed for moderate pain (pain score 4-6).   methocarbamol  500 MG tablet Commonly known as: ROBAXIN  Take 1 tablet (500 mg total) by mouth every 6 (six) hours as  needed for muscle spasms.   mupirocin  ointment 2 % Commonly known as: BACTROBAN  Place 1 Application into the nose 2 (two) times daily for 60 doses. Use as directed 2 times daily for 5 days every other week for 6 weeks.   potassium chloride  10 MEQ tablet Commonly known as: KLOR-CON  Take 10 mEq by mouth in the morning.   VITAMIN D3 PO Take 1 tablet by mouth 4 (four) times a week. Chewable               Durable Medical Equipment  (From admission, onward)           Start     Ordered   06/13/23 1116  DME 3 n 1  Once        06/13/23 1115   06/13/23 1116  DME Walker rolling  Once       Question Answer Comment  Walker: With 5 Inch Wheels   Patient needs a walker to treat with the following condition Status post total replacement of right hip      06/13/23 1115            Diagnostic Studies: DG Pelvis Portable Result Date: 06/13/2023 CLINICAL DATA:  Status post right hip arthroplasty. EXAM: PORTABLE PELVIS 1-2 VIEWS COMPARISON:  04/18/2023 FINDINGS: Right hip arthroplasty in expected alignment. No periprosthetic lucency or fracture. Recent postsurgical change includes air and edema in the soft tissues. Lateral skin staples in place. Previous left hip arthroplasty. IMPRESSION: Right hip arthroplasty without immediate postoperative complication. Electronically Signed   By: Chadwick Colonel M.D.   On: 06/13/2023 11:19   DG HIP UNILAT WITH PELVIS 1V RIGHT Result Date: 06/13/2023 CLINICAL DATA:  Elective surgery EXAM: DG HIP (WITH OR WITHOUT PELVIS) 1V RIGHT COMPARISON:  None Available. FINDINGS: Three fluoroscopic spot views of the pelvis and right hip obtained in the operating room. Images during hip arthroplasty. Fluoroscopy time 15 seconds. Dose 2.3644 mGy. IMPRESSION: Intraoperative fluoroscopy during right hip arthroplasty. Electronically Signed   By: Chadwick Colonel M.D.   On: 06/13/2023 11:19   DG C-Arm 1-60 Min-No Report Result Date: 06/13/2023 Fluoroscopy was utilized  by the requesting physician.  No radiographic interpretation.    Disposition: Discharge disposition: 01-Home or Self Care       Discharge Instructions     Call MD / Call 911  Complete by: As directed    If you experience chest pain or shortness of breath, CALL 911 and be transported to the hospital emergency room.  If you develope a fever above 101 F, pus (white drainage) or increased drainage or redness at the wound, or calf pain, call your surgeon's office.   Constipation Prevention   Complete by: As directed    Drink plenty of fluids.  Prune juice may be helpful.  You may use a stool softener, such as Colace (over the counter) 100 mg twice a day.  Use MiraLax  (over the counter) for constipation as needed.   Diet - low sodium heart healthy   Complete by: As directed    Increase activity slowly as tolerated   Complete by: As directed    Post-operative opioid taper instructions:   Complete by: As directed    POST-OPERATIVE OPIOID TAPER INSTRUCTIONS: It is important to wean off of your opioid medication as soon as possible. If you do not need pain medication after your surgery it is ok to stop day one. Opioids include: Codeine, Hydrocodone(Norco, Vicodin), Oxycodone (Percocet, oxycontin ) and hydromorphone  amongst others.  Long term and even short term use of opiods can cause: Increased pain response Dependence Constipation Depression Respiratory depression And more.  Withdrawal symptoms can include Flu like symptoms Nausea, vomiting And more Techniques to manage these symptoms Hydrate well Eat regular healthy meals Stay active Use relaxation techniques(deep breathing, meditating, yoga) Do Not substitute Alcohol to help with tapering If you have been on opioids for less than two weeks and do not have pain than it is ok to stop all together.  Plan to wean off of opioids This plan should start within one week post op of your joint replacement. Maintain the same interval or  time between taking each dose and first decrease the dose.  Cut the total daily intake of opioids by one tablet each day Next start to increase the time between doses. The last dose that should be eliminated is the evening dose.           Follow-up Information     Arnie Lao, MD Follow up in 2 week(s).   Specialty: Orthopedic Surgery Contact information: [address] [place] [address] [phone]                  Signed: Timothy Ford 06/15/2023, 7:31 AM

## 2023-06-15 NOTE — Progress Notes (Signed)
 Patient ID: Colleen Wiley, female   DOB: [birth year], 73 y.o.   MRN: [id number] Patient is status post right total hip arthroplasty.  She states she is made good progress yesterday and feels comfortable for discharge to home today.  Orders placed for discharge.

## 2023-06-15 NOTE — Progress Notes (Signed)
 Physical Therapy Treatment Patient Details Name: Colleen Wiley MRN: [id number] DOB: Jul 06, 1950 Today's Date: 06/15/2023   History of Present Illness Pt s/p R THR and with hx of L THR 4/25    PT Comments  Pt up to ambulate increased distance in hall and to bathroom for toileting with hand hygiene at sink.  Pt reports no dizziness and pain well controlled with Tylenol .  Pt eager for dc home this date.   If plan is discharge home, recommend the following: A little help with walking and/or transfers;A little help with bathing/dressing/bathroom;Assistance with cooking/housework;Assist for transportation;Help with stairs or ramp for entrance   Can travel by private vehicle        Equipment Recommendations  None recommended by PT    Recommendations for Other Services       Precautions / Restrictions Precautions Precautions: Fall Restrictions Weight Bearing Restrictions Per Provider Order: No LLE Weight Bearing Per Provider Order: Weight bearing as tolerated     Mobility  Bed Mobility Overal bed mobility: Needs Assistance Bed Mobility: Supine to Sit     Supine to sit: Min assist     General bed mobility comments: min assist to manage R LE    Transfers Overall transfer level: Needs assistance Equipment used: Rolling walker (2 wheels) Transfers: Sit to/from Stand Sit to Stand: Supervision           General transfer comment: cues for LE management and use of UEs to self assist    Ambulation/Gait Ambulation/Gait assistance: Contact guard assist, Supervision Gait Distance (Feet): 200 Feet (and 15' back from bathroom) Assistive device: Rolling walker (2 wheels) Gait Pattern/deviations: Step-to pattern, Step-through pattern, Decreased step length - right, Decreased step length - left, Shuffle, Trunk flexed Gait velocity: decr     General Gait Details: pt self-cues for posture, position from RW and initial sequence   Stairs         General stair comments: Pt  states feels comfortable with stairs   Wheelchair Mobility     Tilt Bed    Modified Rankin (Stroke Patients Only)       Balance Overall balance assessment: Needs assistance Sitting-balance support: No upper extremity supported, Feet supported Sitting balance-Leahy Scale: Good     Standing balance support: No upper extremity supported Standing balance-Leahy Scale: Fair                              Hotel manager: No apparent difficulties  Cognition Arousal: Alert Behavior During Therapy: WFL for tasks assessed/performed   PT - Cognitive impairments: No apparent impairments                         Following commands: Intact      Cueing Cueing Techniques: Verbal cues  Exercises      General Comments        Pertinent Vitals/Pain Pain Assessment Pain Assessment: 0-10 Pain Score: 3  Pain Location: R hip Pain Descriptors / Indicators: Aching, Sore, Tightness Pain Intervention(s): Limited activity within patient's tolerance, Monitored during session, Premedicated before session    Home Living                          Prior Function            PT Goals (current goals can now be found in the care plan section) Acute Rehab PT Goals Patient  Stated Goal: Regain IND PT Goal Formulation: With patient Time For Goal Achievement: 06/20/23 Potential to Achieve Goals: Good Progress towards PT goals: Progressing toward goals    Frequency    7X/week      PT Plan      Co-evaluation              AM-PAC PT "6 Clicks" Mobility   Outcome Measure  Help needed turning from your back to your side while in a flat bed without using bedrails?: A Little Help needed moving from lying on your back to sitting on the side of a flat bed without using bedrails?: A Little Help needed moving to and from a bed to a chair (including a wheelchair)?: A Little Help needed standing up from a chair using your arms  (e.g., wheelchair or bedside chair)?: A Little Help needed to walk in hospital room?: A Little Help needed climbing 3-5 steps with a railing? : A Little 6 Click Score: 18    End of Session Equipment Utilized During Treatment: Gait belt Activity Tolerance: Patient tolerated treatment well Patient left: in chair;with call bell/phone within reach;with chair alarm set Nurse Communication: Mobility status PT Visit Diagnosis: Difficulty in walking, not elsewhere classified (R26.2)     Time: 1610-9604 PT Time Calculation (min) (ACUTE ONLY): 22 min  Charges:    $Therapeutic Activity: 8-22 mins PT General Charges $$ ACUTE PT VISIT: 1 Visit                     Thedora Finlay PT Acute Rehabilitation Services Pager [phone] Office [phone]    Chrissi Crow 06/15/2023, 9:46 AM

## 2023-06-16 ENCOUNTER — Encounter (HOSPITAL_COMMUNITY): Payer: Self-pay | Admitting: Orthopaedic Surgery

## 2023-06-26 ENCOUNTER — Ambulatory Visit (INDEPENDENT_AMBULATORY_CARE_PROVIDER_SITE_OTHER): Admitting: Orthopaedic Surgery

## 2023-06-26 DIAGNOSIS — Z96642 Presence of left artificial hip joint: Secondary | ICD-10-CM

## 2023-06-26 DIAGNOSIS — Z96641 Presence of right artificial hip joint: Secondary | ICD-10-CM

## 2023-06-26 NOTE — Progress Notes (Signed)
 The patient is here for first postoperative visit status post a right total hip arthroplasty.  She is still under 3 months status post her left hip replacement.  She is doing very well overall and just ambulating with a cane.  She has been compliant with a baby aspirin  twice daily and only taken Tylenol  for pain.  She has been wearing compressive hose as well.  She is ambulating using with a cane.  Her right hip incision looks great.  Staples are in removed and Steri-Strips applied.  There is no significant seroma and her leg lengths are equal.  She will continue increase her activities as comfort allows.  Will reevaluate her in a month but no x-rays are needed.  If she does feel like she needs outpatient therapy then we will consider it.

## 2023-07-24 ENCOUNTER — Ambulatory Visit: Admitting: Orthopaedic Surgery

## 2023-07-24 ENCOUNTER — Encounter: Payer: Self-pay | Admitting: Orthopaedic Surgery

## 2023-07-24 DIAGNOSIS — Z96642 Presence of left artificial hip joint: Secondary | ICD-10-CM

## 2023-07-24 DIAGNOSIS — Z96641 Presence of right artificial hip joint: Secondary | ICD-10-CM

## 2023-07-24 NOTE — Progress Notes (Signed)
 The patient is now 3 months status post a left total hip arthroplasty in 6 weeks status post a right total hip arthroplasty.  She is 73 years old and active.  She said the right hip was definitely harder than the left hip but she is doing well.  She does ambulate with a cane.  She reports increased motion and strength.  She is not taking any narcotics.  I can put both hips through internal and external rotation with no difficulty at all.  She walks with just a slight limp.  Her leg lengths appear equal.  She will slowly increase her activities as comfort allows.  Will see her back in 3 months with a standing AP pelvis and lateral both hips.

## 2023-08-28 ENCOUNTER — Other Ambulatory Visit: Payer: Self-pay | Admitting: Family Medicine

## 2023-08-28 DIAGNOSIS — Z1231 Encounter for screening mammogram for malignant neoplasm of breast: Secondary | ICD-10-CM

## 2023-10-02 ENCOUNTER — Ambulatory Visit
Admission: RE | Admit: 2023-10-02 | Discharge: 2023-10-02 | Disposition: A | Discharge status: Home / Self Care | Source: Ambulatory Visit | Attending: Family Medicine | Admitting: Family Medicine

## 2023-10-02 ENCOUNTER — Ambulatory Visit

## 2023-10-02 DIAGNOSIS — Z1231 Encounter for screening mammogram for malignant neoplasm of breast: Secondary | ICD-10-CM

## 2023-10-07 ENCOUNTER — Other Ambulatory Visit: Payer: Self-pay | Admitting: Family Medicine

## 2023-10-07 DIAGNOSIS — R928 Other abnormal and inconclusive findings on diagnostic imaging of breast: Secondary | ICD-10-CM

## 2023-10-10 ENCOUNTER — Ambulatory Visit
Admission: RE | Admit: 2023-10-10 | Discharge: 2023-10-10 | Disposition: A | Discharge status: Home / Self Care | Source: Ambulatory Visit | Attending: Family Medicine | Admitting: Family Medicine

## 2023-10-10 ENCOUNTER — Other Ambulatory Visit: Payer: Self-pay | Admitting: Family Medicine

## 2023-10-10 DIAGNOSIS — R921 Mammographic calcification found on diagnostic imaging of breast: Secondary | ICD-10-CM

## 2023-10-10 DIAGNOSIS — R928 Other abnormal and inconclusive findings on diagnostic imaging of breast: Secondary | ICD-10-CM

## 2023-10-15 ENCOUNTER — Ambulatory Visit
Admission: RE | Admit: 2023-10-15 | Discharge: 2023-10-15 | Disposition: A | Discharge status: Home / Self Care | Source: Ambulatory Visit | Attending: Family Medicine | Admitting: Family Medicine

## 2023-10-15 DIAGNOSIS — R921 Mammographic calcification found on diagnostic imaging of breast: Secondary | ICD-10-CM

## 2023-10-15 DIAGNOSIS — D0511 Intraductal carcinoma in situ of right breast: Secondary | ICD-10-CM | POA: Diagnosis not present

## 2023-10-16 LAB — SURGICAL PATHOLOGY

## 2023-10-17 ENCOUNTER — Telehealth: Payer: Self-pay | Admitting: *Deleted

## 2023-10-17 NOTE — Telephone Encounter (Signed)
 Spoke to patient to confirm upcoming morning [facility] clinic appointment on 10/8, paperwork will be sent via email  Gave location and time, also informed patient that the surgeon's office would be calling as well to get information from them similar to the packet that they will be receiving so make sure to do both.  Reminded patient that all providers will be coming to the clinic to see them HERE and if they had any questions to not hesitate to reach back out to myself or their navigators.

## 2023-10-19 ENCOUNTER — Encounter: Payer: Self-pay | Admitting: *Deleted

## 2023-10-19 DIAGNOSIS — D0511 Intraductal carcinoma in situ of right breast: Secondary | ICD-10-CM | POA: Insufficient documentation

## 2023-10-20 ENCOUNTER — Encounter: Payer: Self-pay | Admitting: Genetic Counselor

## 2023-10-21 ENCOUNTER — Encounter: Payer: Self-pay | Admitting: *Deleted

## 2023-10-22 ENCOUNTER — Inpatient Hospital Stay

## 2023-10-22 ENCOUNTER — Ambulatory Visit
Admission: RE | Admit: 2023-10-22 | Discharge: 2023-10-22 | Disposition: A | Discharge status: Home / Self Care | Source: Ambulatory Visit | Attending: Radiation Oncology | Admitting: Radiation Oncology

## 2023-10-22 ENCOUNTER — Encounter: Payer: Self-pay | Admitting: *Deleted

## 2023-10-22 ENCOUNTER — Inpatient Hospital Stay: Admitting: Genetic Counselor

## 2023-10-22 ENCOUNTER — Inpatient Hospital Stay: Admitting: Licensed Clinical Social Worker

## 2023-10-22 ENCOUNTER — Inpatient Hospital Stay: Attending: Hematology and Oncology | Admitting: Hematology and Oncology

## 2023-10-22 ENCOUNTER — Ambulatory Visit: Admitting: Physical Therapy

## 2023-10-22 ENCOUNTER — Ambulatory Visit: Payer: Self-pay | Admitting: Surgery

## 2023-10-22 ENCOUNTER — Encounter: Payer: Self-pay | Admitting: Genetic Counselor

## 2023-10-22 VITALS — BP 135/94 | HR 82 | Temp 98.2°F | Resp 18 | Ht 63.5 in | Wt 194.2 lb

## 2023-10-22 DIAGNOSIS — Z808 Family history of malignant neoplasm of other organs or systems: Secondary | ICD-10-CM | POA: Insufficient documentation

## 2023-10-22 DIAGNOSIS — D0511 Intraductal carcinoma in situ of right breast: Secondary | ICD-10-CM

## 2023-10-22 DIAGNOSIS — Z1501 Genetic susceptibility to malignant neoplasm of breast: Secondary | ICD-10-CM

## 2023-10-22 DIAGNOSIS — Z8042 Family history of malignant neoplasm of prostate: Secondary | ICD-10-CM | POA: Diagnosis not present

## 2023-10-22 DIAGNOSIS — Z803 Family history of malignant neoplasm of breast: Secondary | ICD-10-CM

## 2023-10-22 DIAGNOSIS — Z801 Family history of malignant neoplasm of trachea, bronchus and lung: Secondary | ICD-10-CM | POA: Diagnosis not present

## 2023-10-22 DIAGNOSIS — Z800 Family history of malignant neoplasm of digestive organs: Secondary | ICD-10-CM | POA: Diagnosis not present

## 2023-10-22 DIAGNOSIS — Z79899 Other long term (current) drug therapy: Secondary | ICD-10-CM | POA: Diagnosis not present

## 2023-10-22 DIAGNOSIS — Z8041 Family history of malignant neoplasm of ovary: Secondary | ICD-10-CM | POA: Insufficient documentation

## 2023-10-22 DIAGNOSIS — Z1509 Genetic susceptibility to other malignant neoplasm: Secondary | ICD-10-CM

## 2023-10-22 DIAGNOSIS — Z1589 Genetic susceptibility to other disease: Secondary | ICD-10-CM

## 2023-10-22 DIAGNOSIS — I10 Essential (primary) hypertension: Secondary | ICD-10-CM | POA: Insufficient documentation

## 2023-10-22 DIAGNOSIS — Z96643 Presence of artificial hip joint, bilateral: Secondary | ICD-10-CM | POA: Insufficient documentation

## 2023-10-22 LAB — CMP (CANCER CENTER ONLY)
ALT: 15 U/L (ref 0–44)
AST: 18 U/L (ref 15–41)
Albumin: 4.4 g/dL (ref 3.5–5.0)
Alkaline Phosphatase: 102 U/L (ref 38–126)
Anion gap: 8 (ref 5–15)
BUN: 11 mg/dL (ref 8–23)
CO2: 32 mmol/L (ref 22–32)
Calcium: 10.3 mg/dL (ref 8.9–10.3)
Chloride: 102 mmol/L (ref 98–111)
Creatinine: 0.64 mg/dL (ref 0.44–1.00)
GFR, Estimated: >60 mL/min (ref >60)
Glucose, Bld: 95 mg/dL (ref 70–99)
Potassium: 3.4 mmol/L — ABNORMAL LOW (ref 3.5–5.1)
Sodium: 142 mmol/L (ref 135–145)
Total Bilirubin: 1.5 mg/dL — ABNORMAL HIGH (ref 0.0–1.2)
Total Protein: 8 g/dL (ref 6.5–8.1)

## 2023-10-22 LAB — CBC WITH DIFFERENTIAL (CANCER CENTER ONLY)
Abs Immature Granulocytes: 0.02 K/uL (ref 0.00–0.07)
Basophils Absolute: 0.1 K/uL (ref 0.0–0.1)
Basophils Relative: 1 %
Eosinophils Absolute: 0.3 K/uL (ref 0.0–0.5)
Eosinophils Relative: 3 %
HCT: 45.6 % (ref 36.0–46.0)
Hemoglobin: 15.9 g/dL — ABNORMAL HIGH (ref 12.0–15.0)
Immature Granulocytes: 0 %
Lymphocytes Relative: 30 %
Lymphs Abs: 3.2 K/uL (ref 0.7–4.0)
MCH: 29.6 pg (ref 26.0–34.0)
MCHC: 34.9 g/dL (ref 30.0–36.0)
MCV: 84.8 fL (ref 80.0–100.0)
Monocytes Absolute: 0.8 K/uL (ref 0.1–1.0)
Monocytes Relative: 8 %
Neutro Abs: 6.5 K/uL (ref 1.7–7.7)
Neutrophils Relative %: 58 %
Platelet Count: 377 K/uL (ref 150–400)
RBC: 5.38 MIL/uL — ABNORMAL HIGH (ref 3.87–5.11)
RDW: 13 % (ref 11.5–15.5)
WBC Count: 11 K/uL — ABNORMAL HIGH (ref 4.0–10.5)
nRBC: 0 % (ref 0.0–0.2)

## 2023-10-22 LAB — GENETIC SCREENING ORDER

## 2023-10-22 NOTE — Progress Notes (Signed)
 Radiation Oncology         [phone] ________________________________  Multidisciplinary Breast Oncology Clinic [facility]) Initial Outpatient Consultation  Name: Colleen Wiley MRN: [id number]  Date: 10/22/2023  DOB: 06/28/1950  RR:Yjbzd, Colleen RAMAN, FNP  Vanderbilt Ned, MD   REFERRING PHYSICIAN: Vanderbilt Ned, MD  DIAGNOSIS: The encounter diagnosis was Ductal carcinoma in situ (DCIS) of right breast.  Stage 0 (cTis (DCIS), cN0, cM0) Right Breast UOQ, Intermediate grade DCIS, ER+ / PR+ / Her2 not assessed    ICD-10-CM   1. Ductal carcinoma in situ (DCIS) of right breast  D05.11       HISTORY OF PRESENT ILLNESS:Colleen Wiley is a 73 y.o. female who is presenting to the office today for evaluation of her newly diagnosed breast cancer. She is accompanied by her supportive husband. She is doing well overall.   She had routine screening mammography on 10/02/23 showing a possible abnormality in the right breast. She underwent a diagnostic right breast mammogram at the [facility] on 10/10/23 which further demonstrated indeterminate calcifications in the upper outer right breast spanning approximately 1.2 cm.   Biopsy of the upper outer right breast calcifications on 10/15/23 showed: intermediate grade DCIS measuring 0.2 cm in the greatest linear extent of the sample with necrosis present. Prognostic indicators significant for: estrogen receptor, 100% positive with strong staining intensity and progesterone receptor, 10% positive with moderate to strong staining intensity; HER2 not assessed.   The patient was referred today for presentation in the multidisciplinary conference.  Radiology studies and pathology slides were presented there for review and discussion of treatment options.  A consensus was discussed regarding potential next steps.  PREVIOUS RADIATION THERAPY: No  PAST MEDICAL HISTORY:  Past Medical History:  Diagnosis Date   Arthritis    Family history of breast  cancer    Family history of pancreatic cancer    Family history of prostate cancer    Hypertension     PAST SURGICAL HISTORY: Past Surgical History:  Procedure Laterality Date   BREAST BIOPSY Right 10/15/2023   MM RT BREAST BX W LOC DEV 1ST LESION IMAGE BX SPEC STEREO GUIDE 10/15/2023 [facility] MAMMOGRAP[facility]   CATARACT EXTRACTION Bilateral    COLONOSCOPY     x2   TOTAL HIP ARTHROPLASTY Left 04/18/2023   Procedure: ARTHROPLASTY, HIP, TOTAL, ANTERIOR APPROACH;  Surgeon: Vernetta Lonni GRADE, MD;  Location: [facility];  Service: Orthopedics;  Laterality: Left;   TOTAL HIP ARTHROPLASTY Right 06/13/2023   Procedure: ARTHROPLASTY, HIP, TOTAL, ANTERIOR APPROACH;  Surgeon: Vernetta Lonni GRADE, MD;  Location: [facility];  Service: Orthopedics;  Laterality: Right;    FAMILY HISTORY:  Family History  Problem Relation Age of Onset   Breast cancer Mother 62   Cancer Mother 72       neuroendocrine tumor of pancrease    Prostate cancer Brother    Melanoma Brother        (23y, 29y and 14y   Pancreatic cancer Maternal Uncle    Pancreatic cancer Maternal Uncle    Breast cancer Cousin 43       maternal first cousin   Cancer Cousin        lung cancer   Ovarian cancer Other    Cancer Other        unknown type of cancer     SOCIAL HISTORY:  Social History   Socioeconomic History   Marital status: Married    Spouse name: Not on file   Number of children:  Not on file   Years of education: Not on file   Highest education level: Not on file  Occupational History   Not on file  Tobacco Use   Smoking status: Never   Smokeless tobacco: Never  Vaping Use   Vaping status: Never Used  Substance and Sexual Activity   Alcohol use: Not Currently    Comment: social drinker    Drug use: Never   Sexual activity: Yes  Other Topics Concern   Not on file  Social History Narrative   Not on file   Social Drivers of Health   Financial Resource Strain: Not on file  Food Insecurity: No Food Insecurity  (06/13/2023)   Hunger Vital Sign    Worried About Running Out of Food in the Last Year: Never true    Ran Out of Food in the Last Year: Never true  Transportation Needs: No Transportation Needs (06/13/2023)   PRAPARE - Administrator, Civil Service (Medical): No    Lack of Transportation (Non-Medical): No  Physical Activity: Not on file  Stress: Not on file  Social Connections: Moderately Integrated (06/13/2023)   Social Connection and Isolation Panel    Frequency of Communication with Friends and Family: More than three times a week    Frequency of Social Gatherings with Friends and Family: More than three times a week    Attends Religious Services: Never    Database administrator or Organizations: Yes    Attends Engineer, structural: More than 4 times per year    Marital Status: Married    ALLERGIES: No Known Allergies  MEDICATIONS:  Current Outpatient Medications  Medication Sig Dispense Refill   amLODipine  (NORVASC ) 5 MG tablet Take 5 mg by mouth in the morning.     Cholecalciferol (VITAMIN D3 PO) Take 1 tablet by mouth 4 (four) times a week. Chewable     hydrochlorothiazide  (HYDRODIURIL ) 25 MG tablet Take 25 mg by mouth in the morning.     potassium chloride  (KLOR-CON ) 10 MEQ tablet Take 10 mEq by mouth in the morning.     No current facility-administered medications for this encounter.    REVIEW OF SYSTEMS:  Patient denies any breast specific complaints.    PHYSICAL EXAM:  Lungs are clear to auscultation bilaterally. Heart has regular rate and rhythm. No palpable cervical, supraclavicular, or axillary adenopathy. Abdomen soft, non-tender, normal bowel sounds. MSK: full ROM in bilateral shoulders.  Breast: Left breast with no palpable mass, nipple discharge, or bleeding. Right breast with biopsy lesion covered with surgical tape in the outer breast with associated bruising and indurated tissue palpated beneath.   Vitals:    10/22/23 0852  BP: (!) 135/94   Pulse: 82  Resp: 18  Temp: 98.2 F (36.8 C)  SpO2: 94%       Filed Weights    10/22/23 0852  Weight: 194 lb 3.2 oz (88.1 kg)    KPS = 100  100 - Normal; no complaints; no evidence of disease. 90   - Able to carry on normal activity; minor signs or symptoms of disease. 80   - Normal activity with effort; some signs or symptoms of disease. 82   - Cares for self; unable to carry on normal activity or to do active work. 60   - Requires occasional assistance, but is able to care for most of his personal needs. 50   - Requires considerable assistance and frequent medical care. 40   - Disabled;  requires special care and assistance. 30   - Severely disabled; hospital admission is indicated although death not imminent. 20   - Very sick; hospital admission necessary; active supportive treatment necessary. 10   - Moribund; fatal processes progressing rapidly. 0     - Dead  Karnofsky DA, Abelmann WH, Craver LS and Burchenal [facility] [phone]) The use of the nitrogen mustards in the palliative treatment of carcinoma: with particular reference to bronchogenic carcinoma Cancer 1 [id number]  LABORATORY DATA:  Lab Results  Component Value Date   WBC 15.8 (H) 06/15/2023   HGB 12.4 06/15/2023   HCT 38.3 06/15/2023   MCV 92.1 06/15/2023   PLT 291 06/15/2023   Lab Results  Component Value Date   NA 135 06/14/2023   K 3.0 (L) 06/14/2023   CL 100 06/14/2023   CO2 27 06/14/2023   No results found for: ALT, AST, GGT, ALKPHOS, BILITOT    RADIOGRAPHY: MM RT BREAST BX W LOC DEV 1ST LESION IMAGE BX SPEC STEREO GUIDE Addendum Date: 10/16/2023 ADDENDUM REPORT: 10/16/2023 15:24 ADDENDUM: PATHOLOGY revealed: 1. Breast, right, needle core biopsy, UOQ- DUCTAL CARCINOMA IN SITU, INTERMEDIATE GRADE- NECROSIS: PRESENT- CALCIFICATIONS: PRESENT- DCIS LENGTH: 0.2 CM Pathology results are CONCORDANT with imaging findings, per Dr. Dina Arceo. Pathology results and recommendations were discussed with patient via  telephone on 10/16/23 by Rock Hover RN. Patient reported biopsy site doing well with no adverse symptoms, and only slight tenderness at the site. Post biopsy care instructions were reviewed, questions were answered and my direct phone number was provided. Patient was instructed to call [facility] for any additional questions or concerns related to biopsy site. The patient was referred to [facility] on 10/22/23. Pathology results reported by Rock Hover RN on 10/16/2023. Electronically Signed   By: Dina  Arceo M.D.   On: 10/16/2023 15:24   Addendum Date: 10/15/2023 ADDENDUM REPORT: 10/15/2023 10:21 ADDENDUM: This is an addendum to stereotactic biopsy performed on 10/15/2023. At the conclusion of the procedure a X shaped biopsy marker clip was deployed into the biopsy cavity. Follow-up two-view mammogram was performed and dictated separately. Electronically Signed   By: Dina  Arceo M.D.   On: 10/15/2023 10:21   Result Date: 10/16/2023 CLINICAL DATA:  Suspicious right breast calcifications. EXAM: RIGHT BREAST STEREOTACTIC CORE NEEDLE BIOPSY COMPARISON:  Previous exam(s). FINDINGS: The patient and I discussed the procedure of stereotactic-guided biopsy including benefits and alternatives. We discussed the high likelihood of a successful procedure. We discussed the risks of the procedure including infection, bleeding, tissue injury, clip migration, and inadequate sampling. Informed written consent was given. The usual time out protocol was performed immediately prior to the procedure. Using sterile technique and 1% lidocaine  and 1% lidocaine  with epinephrine as local anesthetic, under stereotactic guidance, a 9 gauge vacuum assisted device was used to perform core needle biopsy of calcifications in the upper-outer quadrant of the right breast using a superior to inferior approach. Specimen radiograph was performed  showing calcifications are present in the tissue samples. Specimens with calcifications are identified for pathology. Lesion quadrant: Upper-outer quadrant At the conclusion of the procedure, at shaped tissue marker clip was deployed into the biopsy cavity. Follow-up 2-view mammogram was performed and dictated separately. IMPRESSION: Stereotactic-guided biopsy of the right breast. No apparent complications. Electronically Signed: By: Dina  Arceo M.D. On: 10/15/2023 09:12   MM CLIP PLACEMENT RIGHT Result Date: 10/15/2023 CLINICAL DATA:  Status post stereotactic biopsy  of right breast calcifications. EXAM: 3D DIAGNOSTIC RIGHT MAMMOGRAM POST STEREOTACTIC BIOPSY COMPARISON:  Previous exam(s). ACR Breast Density Category b: There are scattered areas of fibroglandular density. FINDINGS: 3D Mammographic images were obtained following stereotactic guided biopsy of the right breast. The biopsy marking clip is in expected location in the upper-outer quadrant of the right breast. IMPRESSION: Appropriate positioning of the X shaped biopsy marking clip at the site of biopsy in the upper-outer quadrant of the right breast. Final Assessment: Post Procedure Mammograms for Marker Placement Electronically Signed   By: Harrie Deis M.D.   On: 10/15/2023 09:16   MM Digital Diagnostic Unilat R Result Date: 10/10/2023 CLINICAL DATA:  Patient was recalled from screening mammogram for right breast calcifications. EXAM: DIGITAL DIAGNOSTIC UNILATERAL RIGHT MAMMOGRAM TECHNIQUE: Right digital diagnostic mammography was performed. COMPARISON:  Previous exam(s). ACR Breast Density Category a: The breasts are almost entirely fatty. FINDINGS: Additional imaging of the right breast was performed. There are developing grouped punctate calcifications in the upper-outer quadrant the right breast, middle to posterior depth spanning an area of 1.2 cm. They are indeterminate. IMPRESSION: Indeterminate calcifications in the upper-outer quadrant of  the right breast spanning 1.2 cm. RECOMMENDATION: Stereotactic biopsy of the calcifications in the upper-outer quadrant of the right breast is recommended. I have discussed the findings and recommendations with the patient. If applicable, a reminder letter will be sent to the patient regarding the next appointment. BI-RADS CATEGORY  4: Suspicious. Electronically Signed   By: Dina  Arceo M.D.   On: 10/10/2023 16:06   MM 3D SCREENING MAMMOGRAM BILATERAL BREAST Result Date: 10/06/2023 CLINICAL DATA:  Screening. EXAM: DIGITAL SCREENING BILATERAL MAMMOGRAM WITH TOMOSYNTHESIS AND CAD TECHNIQUE: Bilateral screening digital craniocaudal and mediolateral oblique mammograms were obtained. Bilateral screening digital breast tomosynthesis was performed. The images were evaluated with computer-aided detection. COMPARISON:  Previous exam(s). ACR Breast Density Category a: The breasts are almost entirely fatty. FINDINGS: In the right breast, calcifications warrant further evaluation with magnified views. In the left breast, no findings suspicious for malignancy. IMPRESSION: Further evaluation is suggested for calcifications in the right breast. RECOMMENDATION: Diagnostic mammogram of the right breast. (Code:[id number]) The patient will be contacted regarding the findings, and additional imaging will be scheduled. BI-RADS CATEGORY  0: Incomplete: Need additional imaging evaluation. Electronically Signed   By: Alm Parkins M.D.   On: 10/06/2023 13:03      IMPRESSION: Stage 0 (cTis (DCIS), cN0, cM0) Right Breast UOQ, Intermediate grade DCIS, ER+ / PR+ / Her2 not assessed  Patient's case was discussed at our tumor board earlier this morning and the consensus was to proceed with a lumpectomy.   We discussed adjuvant treatments today. One option would be antiestrogen therapy as discussed with medical oncology. She would take a pill for approximately 5 years. Alternatively, she could receive adjuvant radiation to decrease her  risk of locoregional disease recurrence. The more aggressive option would be to pursue both modalities.  Considering her favorable, early-stage disease, radiation therapy offers minimal benefit for locoregional disease control. Therefore, radiotherapy is optional for her if she decides to undergo anti-estrogen therapy and surgical pathology remains favorable.  At this time, patient is undecided on which adjuvant treatment modality she would like to proceed with. We will see the patient approximately 4 weeks after surgery to review final pathology and discuss her options again.    PLAN:  Lumpectomy Adjuvant radiation Antiestrogens   ------------------------------------------------   Leeroy Due, PA-C   Porfiria Heinrich D. Kyna Blahnik, PhD, MD   [facility]  Radiation Oncology Direct Dial: [phone]  Fax: [phone] [email or web address]   This document serves as a record of services personally performed by Lynwood Nasuti, MD. It was created on his behalf by Dorthy Fuse, a trained medical scribe. The creation of this record is based on the scribe's personal observations and the provider's statements to them. This document has been checked and approved by the attending provider.

## 2023-10-22 NOTE — Progress Notes (Signed)
 [facility] Clinical Social Work  Initial Assessment   Colleen Wiley is a 73 y.o. year old female accompanied by spouse, Alm. Clinical Social Work was referred by [facility] for assessment of psychosocial needs.   SDOH (Social Determinants of Health) assessments performed: Yes SDOH Interventions    Flowsheet Row Clinical Support from 10/22/2023 in [facility] A Dept Of [place]  SDOH Interventions   Food Insecurity Interventions Intervention Not Indicated  Housing Interventions Intervention Not Indicated  Transportation Interventions Intervention Not Indicated  Utilities Interventions Intervention Not Indicated    SDOH Screenings   Food Insecurity: No Food Insecurity (10/22/2023)  Housing: Low Risk  (10/22/2023)  Transportation Needs: No Transportation Needs (10/22/2023)  Utilities: Not At Risk (10/22/2023)  Depression (PHQ2-9): Low Risk  (10/22/2023)  Social Connections: Moderately Integrated (06/13/2023)  Tobacco Use: Low Risk  (10/22/2023)   Received from [facility]    PHQ 2/9:    10/22/2023   11:25 AM  Depression screen PHQ 2/9  Decreased Interest 0  Down, Depressed, Hopeless 0  PHQ - 2 Score 0     Distress Screen completed: No     No data to display            Family/Social Information:  Housing Arrangement: patient lives with husband Family members/support persons in your life? Family and Friends Transportation concerns: no  Employment: Retired. Involved in educator's groups  Income source: Actor concerns: No Type of concern: None Food access concerns: no Religious or spiritual practice: Not known Advanced directives: Yes-not on file Services Currently in place:  [employer] Medicare  Coping/ Adjustment to diagnosis: Patient understands treatment plan and what happens next? yes, will have surgery. Viewing this as a bump in the road and plans to continue her activities as much as  possible. She already had two hip replacements this year and is now able to engage more in activities Patient reported stressors: Adjusting to my illness Patient enjoys time with family/ friends and college basketball, social activities Current coping skills/ strengths: Ability for insight , Active sense of humor , Capable of independent living , Communication skills , Motivation for treatment/growth , and Supportive family/friends     SUMMARY: Current SDOH Barriers:  No major barriers identified today  Clinical Social Work Clinical Goal(s):  No clinical social work goals at this time  Interventions: Discussed common feeling and emotions when being diagnosed with cancer, and the importance of support during treatment Informed patient of the support team roles and support services at [facility] Provided CSW contact information and encouraged patient to call with any questions or concerns   Follow Up Plan: Patient will contact CSW with any support or resource needs Patient verbalizes understanding of plan: Yes    Koltyn Kelsay E Dhairya Corales, LCSW Clinical Social Worker [facility]

## 2023-10-22 NOTE — Progress Notes (Signed)
 [facility] CONSULT NOTE  Patient Care Team: [name], FNP as PCP - General (Family Medicine) Gerome, Devere HERO, RN as Oncology Nurse Navigator Tyree Nanetta SAILOR, RN as Oncology Nurse Navigator Vanderbilt Ned, MD as Consulting Physician (General Surgery) [name], MD as Consulting Physician (Hematology and Oncology) Shannon Agent, MD as Consulting Physician (Radiation Oncology)  CHIEF COMPLAINTS/PURPOSE OF CONSULTATION:  Newly diagnosed breast cancer  HISTORY OF PRESENTING ILLNESS:  Colleen Wiley 73 y.o. female is here because of recent diagnosis of right breast DCIS  I reviewed her records extensively and collaborated the history with the patient.  SUMMARY OF ONCOLOGIC HISTORY: Oncology History  Ductal carcinoma in situ (DCIS) of right breast  10/02/2023 Mammogram   In the right breast, calcifications warrant further evaluation with magnified views. In the left breast, no findings suspicious for malignancy. Stereotactic biopsy of the calcifications in the upper-outer quadrant of the right breast is recommended.   10/15/2023 Pathology Results   1. Breast, right, needle core biopsy, UOQ :       - DUCTAL CARCINOMA IN SITU, INTERMEDIATE GRADE       - NECROSIS: PRESENT       - CALCIFICATIONS: PRESENT       - DCIS LENGTH: 0.2 CM   Estrogen Receptor:  100%, positive, strong staining intensity  Progesterone Receptor:  10%, positive, moderate to strong staining intensity    10/19/2023 Initial Diagnosis   Ductal carcinoma in situ (DCIS) of right breast     Discussed the use of AI scribe software for clinical note transcription with the patient, who gave verbal consent to proceed.  History of Present Illness  Colleen Wiley is a 73 year old female with ductal carcinoma in situ of the right breast who presents for consultation regarding treatment options.  The diagnosis was made following the identification of calcifications on a mammogram.  She is  currently considering treatment options, including surgery, radiation, and medication. She is interested in understanding the statistical outcomes of each treatment choice.  Her past medical history includes hypertension, for which she takes amlodipine  and hydrochlorothiazide . She has also had bilateral hip replacements and cataract surgery. Her current medications include amlodipine , hydrochlorothiazide , vitamin D3, and potassium.  She has a family history of breast cancer, as her mother also had the disease. She is retired and previously worked as an Tourist information centre manager.  No current use of baby aspirin  and hydrocodone .  MEDICAL HISTORY:  Past Medical History:  Diagnosis Date   Arthritis    Hypertension     SURGICAL HISTORY: Past Surgical History:  Procedure Laterality Date   BREAST BIOPSY Right 10/15/2023   MM RT BREAST BX W LOC DEV 1ST LESION IMAGE BX SPEC STEREO GUIDE 10/15/2023 [facility]   CATARACT EXTRACTION Bilateral    COLONOSCOPY     x2   TOTAL HIP ARTHROPLASTY Left 04/18/2023   Procedure: ARTHROPLASTY, HIP, TOTAL, ANTERIOR APPROACH;  Surgeon: Vernetta Lonni GRADE, MD;  Location: [facility];  Service: Orthopedics;  Laterality: Left;   TOTAL HIP ARTHROPLASTY Right 06/13/2023   Procedure: ARTHROPLASTY, HIP, TOTAL, ANTERIOR APPROACH;  Surgeon: Vernetta Lonni GRADE, MD;  Location: [facility];  Service: Orthopedics;  Laterality: Right;    SOCIAL HISTORY: Social History   Socioeconomic History   Marital status: Married    Spouse name: Not on file   Number of children: Not on file   Years of education: Not on file   Highest education level: Not on file  Occupational  History   Not on file  Tobacco Use   Smoking status: Never   Smokeless tobacco: Never  Vaping Use   Vaping status: Never Used  Substance and Sexual Activity   Alcohol use: Not Currently    Comment: social drinker    Drug use: Never   Sexual activity: Yes  Other Topics Concern   Not on file   Social History Narrative   Not on file   Social Drivers of Health   Financial Resource Strain: Not on file  Food Insecurity: No Food Insecurity (06/13/2023)   Hunger Vital Sign    Worried About Running Out of Food in the Last Year: Never true    Ran Out of Food in the Last Year: Never true  Transportation Needs: No Transportation Needs (06/13/2023)   PRAPARE - Administrator, Civil Service (Medical): No    Lack of Transportation (Non-Medical): No  Physical Activity: Not on file  Stress: Not on file  Social Connections: Moderately Integrated (06/13/2023)   Social Connection and Isolation Panel    Frequency of Communication with Friends and Family: More than three times a week    Frequency of Social Gatherings with Friends and Family: More than three times a week    Attends Religious Services: Never    Database administrator or Organizations: Yes    Attends Engineer, structural: More than 4 times per year    Marital Status: Married  Catering manager Violence: Not At Risk (06/13/2023)   Humiliation, Afraid, Rape, and Kick questionnaire    Fear of Current or Ex-Partner: No    Emotionally Abused: No    Physically Abused: No    Sexually Abused: No    FAMILY HISTORY: Family History  Problem Relation Age of Onset   Breast cancer Mother 16   Cancer Mother 68       neuroendocrine tumor of pancrease    Prostate cancer Brother    Melanoma Brother        (23y, 3y and 51y   Ovarian cancer Maternal Grandmother    Pancreatic cancer Maternal Uncle    Pancreatic cancer Maternal Uncle    Breast cancer Cousin 62   Cancer Cousin        lung cancer   Ovarian cancer Other    Cancer Other        unknown type of cancer     ALLERGIES:  has no known allergies.  MEDICATIONS:  Current Outpatient Medications  Medication Sig Dispense Refill   amLODipine  (NORVASC ) 5 MG tablet Take 5 mg by mouth in the morning.     aspirin  81 MG chewable tablet Chew 1 tablet (81 mg total)  by mouth 2 (two) times daily. 35 tablet 0   chlorhexidine  (HIBICLENS ) 4 % external liquid Apply 15 mLs (1 Application total) topically as directed for 30 doses. Use as directed daily for 5 days every other week for 6 weeks. 946 mL 1   Cholecalciferol (VITAMIN D3 PO) Take 1 tablet by mouth 4 (four) times a week. Chewable     hydrochlorothiazide  (HYDRODIURIL ) 25 MG tablet Take 25 mg by mouth in the morning.     HYDROcodone -acetaminophen  (NORCO) 7.5-325 MG tablet Take 1 tablet by mouth every 6 (six) hours as needed for moderate pain (pain score 4-6). 30 tablet 0   methocarbamol  (ROBAXIN ) 500 MG tablet Take 1 tablet (500 mg total) by mouth every 6 (six) hours as needed for muscle spasms. 40 tablet 1  potassium chloride  (KLOR-CON ) 10 MEQ tablet Take 10 mEq by mouth in the morning.     No current facility-administered medications for this visit.    REVIEW OF SYSTEMS:   Constitutional: Denies fevers, chills or abnormal night sweats Eyes: Denies blurriness of vision, double vision or watery eyes Ears, nose, mouth, throat, and face: Denies mucositis or sore throat Respiratory: Denies cough, dyspnea or wheezes Cardiovascular: Denies palpitation, chest discomfort or lower extremity swelling Gastrointestinal:  Denies nausea, heartburn or change in bowel habits Skin: Denies abnormal skin rashes Lymphatics: Denies new lymphadenopathy or easy bruising Neurological:Denies numbness, tingling or new weaknesses Behavioral/Psych: Mood is stable, no new changes  Breast:  Denies any palpable lumps or discharge All other systems were reviewed with the patient and are negative.  PHYSICAL EXAMINATION: ECOG PERFORMANCE STATUS: 0 - Asymptomatic  Vitals:   10/22/23 0852  BP: (!) 135/94  Pulse: 82  Resp: 18  Temp: 98.2 F (36.8 C)  SpO2: 94%   Filed Weights   10/22/23 0852  Weight: 194 lb 3.2 oz (88.1 kg)    GENERAL:alert, no distress and comfortable Post biopsy changes in the breast. No palpable  masses. No regional adenopathy CTA bilaterally RRR No LE edema.  LABORATORY DATA:  I have reviewed the data as listed Lab Results  Component Value Date   WBC 15.8 (H) 06/15/2023   HGB 12.4 06/15/2023   HCT 38.3 06/15/2023   MCV 92.1 06/15/2023   PLT 291 06/15/2023   Lab Results  Component Value Date   NA 135 06/14/2023   K 3.0 (L) 06/14/2023   CL 100 06/14/2023   CO2 27 06/14/2023    RADIOGRAPHIC STUDIES: I have personally reviewed the radiological reports and agreed with the findings in the report.  ASSESSMENT AND PLAN:   This is a very pleasant 73 yr old female patient with newly diagnosed DCIS of right breast, ER positive, PR positive referred to hematology for additional recommendations.  Pathology review: I discussed with the patient the difference between DCIS and invasive breast cancer. It is considered a precancerous lesion. DCIS is classified as a Stage 0 breast cancer. It is generally detected through mammograms as calcifications. We discussed the significance of grades and its impact on prognosis. We also discussed the importance of ER and PR receptors and their implications to adjuvant treatment options. Prognosis of DCIS dependence on grade and degree of comedo necrosis. It is anticipated that if not treated, 20-30% of DCIS can develop into invasive breast cancer.  Recommendation: 1. Breast conserving surgery 2. Followed by adjuvant radiation therapy 3. Followed by antiestrogen therapy with tamoxifen/aromatase inhibitors based on menopausal status 5 years  Tamoxifen counseling: We discussed the risks and benefits of tamoxifen. These include but not limited to insomnia, hot flashes, mood changes, vaginal dryness, and weight gain. Although rare, serious side effects including endometrial cancer, risk of blood clots were also discussed. We strongly believe that the benefits far outweigh the risks. Patient understands these risks and consented to starting treatment.  Planned treatment duration is 5 years.  Aromatase inhibitors counseling: We have discussed the mechanism of action of aromatase inhibitors today.  We have discussed adverse effects including but not limited to menopausal symptoms, increased risk of osteoporosis and fractures, cardiovascular events, arthralgias and myalgias.  We do believe that the benefits far outweigh the risks.  Plan treatment duration of 5 years.  We discussed risk of recurrence based on [facility] nomogram with or without radiation and without any adj therapy She is  not yet sure if she wants to consider adj radiation or antiestrogen therapy at this time. She will RTC after surgery to discuss additional recommendations.  All questions were answered. The patient knows to call the clinic with any problems, questions or concerns.    Amber Stalls, MD 10/22/23

## 2023-10-22 NOTE — Progress Notes (Signed)
 REFERRING PROVIDER: Vanderbilt Ned, MD [address] [place],  [place] [address]  PRIMARY PROVIDER:  [name], FNP  PRIMARY REASON FOR VISIT:  1. Family history of breast cancer   2. Family history of pancreatic cancer   3. Ductal carcinoma in situ (DCIS) of right breast   4. Monoallelic mutation of ATM gene      HISTORY OF PRESENT ILLNESS:   Colleen Wiley, a 73 y.o. female, was seen for a [facility] cancer genetics consultation at the request of Dr. Vanderbilt due to a personal and family history of cancer.  Colleen Wiley presents to clinic today to discuss the possibility of a hereditary predisposition to cancer, genetic testing, and to further clarify her future cancer risks, as well as potential cancer risks for family members.   In September 2025, at the age of 21, Colleen Wiley was diagnosed with DCIS of the right breast. The treatment plan lumpectomy and radiation.  In 2019 Colleen Wiley underwent genetic testing for a family ATM variant found in her mother which was positive.      CANCER HISTORY:  Oncology History  Ductal carcinoma in situ (DCIS) of right breast  10/02/2023 Mammogram   In the right breast, calcifications warrant further evaluation with magnified views. In the left breast, no findings suspicious for malignancy. Stereotactic biopsy of the calcifications in the upper-outer quadrant of the right breast is recommended.   10/15/2023 Pathology Results   1. Breast, right, needle core biopsy, UOQ :       - DUCTAL CARCINOMA IN SITU, INTERMEDIATE GRADE       - NECROSIS: PRESENT       - CALCIFICATIONS: PRESENT       - DCIS LENGTH: 0.2 CM   Estrogen Receptor:  100%, positive, strong staining intensity  Progesterone Receptor:  10%, positive, moderate to strong staining intensity    10/19/2023 Initial Diagnosis   Ductal carcinoma in situ (DCIS) of right breast     Past Medical History:  Diagnosis Date   Arthritis    Family history of breast cancer     Family history of pancreatic cancer    Family history of prostate cancer    Hypertension     Past Surgical History:  Procedure Laterality Date   BREAST BIOPSY Right 10/15/2023   MM RT BREAST BX W LOC DEV 1ST LESION IMAGE BX SPEC STEREO GUIDE 10/15/2023 [facility]   CATARACT EXTRACTION Bilateral    COLONOSCOPY     x2   TOTAL HIP ARTHROPLASTY Left 04/18/2023   Procedure: ARTHROPLASTY, HIP, TOTAL, ANTERIOR APPROACH;  Surgeon: Vernetta Lonni GRADE, MD;  Location: [facility];  Service: Orthopedics;  Laterality: Left;   TOTAL HIP ARTHROPLASTY Right 06/13/2023   Procedure: ARTHROPLASTY, HIP, TOTAL, ANTERIOR APPROACH;  Surgeon: Vernetta Lonni GRADE, MD;  Location: [facility];  Service: Orthopedics;  Laterality: Right;    Social History   Socioeconomic History   Marital status: Married    Spouse name: Not on file   Number of children: Not on file   Years of education: Not on file   Highest education level: Not on file  Occupational History   Not on file  Tobacco Use   Smoking status: Never   Smokeless tobacco: Never  Vaping Use   Vaping status: Never Used  Substance and Sexual Activity   Alcohol use: Not Currently    Comment: social drinker    Drug use: Never   Sexual activity: Yes  Other Topics Concern  Not on file  Social History Narrative   Not on file   Social Drivers of Health   Financial Resource Strain: Not on file  Food Insecurity: No Food Insecurity (10/22/2023)   Hunger Vital Sign    Worried About Running Out of Food in the Last Year: Never true    Ran Out of Food in the Last Year: Never true  Transportation Needs: No Transportation Needs (10/22/2023)   PRAPARE - Administrator, Civil Service (Medical): No    Lack of Transportation (Non-Medical): No  Physical Activity: Not on file  Stress: Not on file  Social Connections: Moderately Integrated (06/13/2023)   Social Connection and Isolation Panel    Frequency of Communication with Friends and  Family: More than three times a week    Frequency of Social Gatherings with Friends and Family: More than three times a week    Attends Religious Services: Never    Database administrator or Organizations: Yes    Attends Engineer, structural: More than 4 times per year    Marital Status: Married     FAMILY HISTORY:  We obtained a detailed, 4-generation family history.  Significant diagnoses are listed below: Family History  Problem Relation Age of Onset   Breast cancer Mother 39   Cancer Mother 69       neuroendocrine tumor of pancrease    Prostate cancer Brother    Melanoma Brother        (23y, 50y and 64y   Pancreatic cancer Maternal Uncle    Pancreatic cancer Maternal Uncle    Cancer Paternal Uncle        NOS   Breast cancer Cousin 65       maternal first cousin   Cancer Cousin        lung cancer   Uterine cancer Cousin        pat cousin   Ovarian cancer Other    Cancer Other        unknown type of cancer      The patient does not have children.  She has two brothers, one who had prostate cancer and has the ATM variant and the other who had melanoma 3x and is ATM negative.  Both parents are deceased.  The patient's father had a brother with an unknown cancer and a sister who had a daughter who had uterine cancer.  The patient's mother had breast cancer and PNET.  She had two brothers with pancreatic cancer and a one brother had two daughters with breast cancer.  The maternal great aunt had ovarian cancer.  Colleen Wiley is unaware of previous family history of genetic testing for hereditary cancer risks. There is no reported Ashkenazi Jewish ancestry. There is no known consanguinity.  GENETIC COUNSELING ASSESSMENT: Colleen Wiley is a 73 y.o. female with a personal and family history of cancer which is somewhat suggestive of a hereditary cancer syndrome, other than just ATM related cancers, and predisposition to cancer given her brother's 3 diagnoses of melanoma and the  combination of pancreatic cancer, and PNET. We, therefore, discussed and recommended the following at today's visit.   DISCUSSION: We discussed that, in general, most cancer is not inherited in families, but instead is sporadic or familial. Sporadic cancers occur by chance and typically happen at older ages (>50 years) as this type of cancer is caused by genetic changes acquired during an individual's lifetime. Some families have more cancers than would be expected by  chance; however, the ages or types of cancer are not consistent with a known genetic mutation or known genetic mutations have been ruled out. This type of familial cancer is thought to be due to a combination of multiple genetic, environmental, hormonal, and lifestyle factors. While this combination of factors likely increases the risk of cancer, the exact source of this risk is not currently identifiable or testable.  We discussed that 5 - 10% of cancer is hereditary, with most cases of melanoma associated with CDKN2A mutations.  There are other genes that can be associated with hereditary melanoma syndromes.  These include BAP1, POT1 and MITF.  There are other cancers in the family that can be associated with hereditary cancer syndromes outside of ATM.  We discussed that testing is beneficial for several reasons including knowing how to follow individuals after completing their treatment, identifying whether potential treatment options such as PARP inhibitors would be beneficial, and understand if other family members could be at risk for cancer and allow them to undergo genetic testing.   We reviewed the characteristics, features and inheritance patterns of hereditary cancer syndromes. We also discussed genetic testing, including the appropriate family members to test, the process of testing, insurance coverage and turn-around-time for results. We discussed the implications of a negative, positive and/or variant of uncertain significant  result. We recommended Colleen Wiley pursue genetic testing for the CancerNext+RNA.   Based on Colleen Wiley's personal and family history of cancer, she meets medical criteria for genetic testing. Despite that she meets criteria, she may still have an out of pocket cost.   We also updated her genetic counseling for ATM mutations since guidelines have changed since 2019.  Clinical Information: Hereditary breast due to pathogenic variants in ATM is characterized by an increased lifetime risk for, generally, adult-onset cancers including, breast, contralateral breast, ovarian, prostate, pancreatic and possibly colon cancer.  The cancers associated with ATM are:  Female breast cancer, up to an 24% risk In women with a history of breast cancer, the cumulative risk for contralateral breast cancer 10 years after breast cancer diagnosis is 4%.  Ovarian cancer, up to a 3% risk Pancreatic cancer, 5-10% risk Prostate cancer, elevated risk Colon cancer, up to a 10% risk    Management Recommendations:  Management for individuals with ATM mutations can be found in the NCCN guidelines (v1.2026).  These guidelines recommend the following:  Breast Cancer    Absolute risk: 21-24% Screening: Annual mammogram with consideration of tomosynthesis at age 43 and consider breast MRI with contrast starting at age 8 - 34 years. Risk Reducing Mastectomy: Evidence insufficient, consider based on family history Contralateral 10 year breast cancer risk: 4%  Ovarian Cancer  Absolute risk: 2-3% Evidence is insufficient for risk-reducing salpingo-oophorectomy (RRSO).  This risk should be managed by family history.  Pancreatic Cancer   Absolute risk: ~5-10% Consider screening starting at age 32 years (or 10 years younger than the earliest exocrine pancreatic cancer diagnosis in the family, whichever is earlier) for individuals with exocrine pancreatic cancer in >=1 first- or second-degree relatives from the same side  of (or presumed to be from the same side of) the family as the identified P/LP germline variant  Prostate Cancer  Emerging evidence for association with increased risk. Consider prostate cancer screening starting at age 73.  Colon Cancer  Estimated Absolute risk: ~5-10% Evidence insufficient to provide specialized CRC screening recommendations, manage based on family history   This information is based on current understanding of the  gene and may change in the future.   FAMILY MEMBERS: Hereditary predisposition to cancer due to pathogenic variants in the ATM gene has autosomal dominant inheritance. This means that an individual with a pathogenic variant has a 50% chance of passing the condition on to their offspring. Once a pathogenic mutation is detected in an individual, it is possible to identify at-risk relatives who can pursue testing for this specific familial variant. Many cases are inherited from a parent, but some cases may occur spontaneously (i.e., an individual with a pathogenic variant who has parents who do not have it).  Individuals with a single pathogenic ATM variant are also carriers of autosomal recessive Ataxia Telangiectasia. Ataxia Telangiectasia is characterized by childhood onset of progressive neurologic manifestations, immunodeficiency, pulmonary disease,  and increased risk for cancer. For there to be a risk of Ataxia Telangiectasia in offspring, both the patient and their partner would each have to carry a pathogenic variant in ATM; in this case, the risk to have an affected child is 25%.It is important that all of Colleen Wiley's relatives (both men and women) know of the presence of this gene mutation. Site-specific genetic testing can sort out who in the family is at risk and who is not. It is important that all of Colleen Wiley's relatives (both men and women) know of the presence of this gene mutation. Site-specific genetic testing can sort out who in the family is at risk  and who is not.   Colleen Wiley siblings have both had genetic testing.   PLAN: After considering the risks, benefits, and limitations, Colleen Wiley provided informed consent to pursue genetic testing and the blood sample was sent to [facility] for analysis of the CancerNext+RNA. Results should be available within approximately 2-3 weeks' time, at which point they will be disclosed by telephone to Colleen Wiley, as will any additional recommendations warranted by these results. Colleen Wiley will receive a summary of her genetic counseling visit and a copy of her results once available. This information will also be available in Epic.   Lastly, we encouraged Colleen Wiley to remain in contact with cancer genetics annually so that we can continuously update the family history and inform her of any changes in cancer genetics and testing that may be of benefit for this family.   Colleen Wiley questions were answered to her satisfaction today. Our contact information was provided should additional questions or concerns arise. Thank you for the referral and allowing us  to share in the care of your patient.   Floreen Teegarden P. Perri, MS, CGC Licensed, Patent attorney [email or web address] phone: [phone]  In total, 40 minutes were spent on the date of the encounter in service to the patient including preparation, face-to-face consultation, documentation and care coordination.  The patient brought her husband. Drs. Lanny Stalls, and/or Gudena were available for questions, if needed..    _______________________________________________________________________ For Office Staff:  Number of people involved in session: 2 Was an Intern/ student involved with case: no

## 2023-10-24 ENCOUNTER — Other Ambulatory Visit: Payer: Self-pay | Admitting: *Deleted

## 2023-10-24 ENCOUNTER — Other Ambulatory Visit: Payer: Self-pay | Admitting: Surgery

## 2023-10-24 DIAGNOSIS — D0511 Intraductal carcinoma in situ of right breast: Secondary | ICD-10-CM

## 2023-10-29 ENCOUNTER — Ambulatory Visit: Admitting: Orthopaedic Surgery

## 2023-10-30 ENCOUNTER — Encounter: Payer: Self-pay | Admitting: *Deleted

## 2023-10-30 ENCOUNTER — Telehealth: Payer: Self-pay | Admitting: *Deleted

## 2023-10-30 NOTE — Telephone Encounter (Signed)
 Spoke with patient to follow up from [facility] 10/8 and asses navigation needs. Patient denies any questions or concerns at this time. Encouraged her to call should anything arise. Patient verbalized understanding.

## 2023-11-03 ENCOUNTER — Other Ambulatory Visit: Payer: Self-pay

## 2023-11-03 ENCOUNTER — Ambulatory Visit: Admitting: Orthopaedic Surgery

## 2023-11-03 ENCOUNTER — Encounter (HOSPITAL_BASED_OUTPATIENT_CLINIC_OR_DEPARTMENT_OTHER): Payer: Self-pay | Admitting: Surgery

## 2023-11-03 ENCOUNTER — Telehealth: Payer: Self-pay | Admitting: Hematology and Oncology

## 2023-11-03 ENCOUNTER — Encounter: Payer: Self-pay | Admitting: Orthopaedic Surgery

## 2023-11-03 DIAGNOSIS — Z96643 Presence of artificial hip joint, bilateral: Secondary | ICD-10-CM

## 2023-11-03 NOTE — Progress Notes (Signed)
 The patient is here in follow-up as a relates to both of her hips.  Replaced.  We replaced her left hip in April of this year and her right hip in May of this year both secondary to severe arthritis.  The patient is now 6 months status post a left total hip arthroplasty in 5 months status post a right total hip arthroplasty.  These were done 6 weeks apart.  She is doing well overall.  She is walking without assistive device and is very pleased.  She is a very active 73 year old female.  She does have most cancer surgery coming up but she says that it is at a lower stage and hopefully everything will go well.  She is very pleased with her hip replacements.  She is walking without any assistive device and has made excellent progress.  Exam she gets out of chair easily.  She is a very active 73 year old female.  Both hips move smoothly and fluidly and her leg lengths appear equal.  Standing AP pelvis and lateral both hips shows well-seated total hip arthroplasties with no complicating features.  She will continue to increase her activities as comfort allows.  Will see her back for final visit in 6 months with just a standing AP pelvis.  If there are issues with the hips before then she knows to let us  know.

## 2023-11-03 NOTE — Telephone Encounter (Signed)
 left vm for pt about scheduled appt date and time. Encouraged to call back if need to reschedule

## 2023-11-07 ENCOUNTER — Ambulatory Visit
Admission: RE | Admit: 2023-11-07 | Discharge: 2023-11-07 | Disposition: A | Discharge status: Home / Self Care | Source: Ambulatory Visit | Attending: Surgery | Admitting: Surgery

## 2023-11-07 DIAGNOSIS — D0511 Intraductal carcinoma in situ of right breast: Secondary | ICD-10-CM

## 2023-11-07 NOTE — Progress Notes (Signed)

## 2023-11-11 ENCOUNTER — Encounter (HOSPITAL_BASED_OUTPATIENT_CLINIC_OR_DEPARTMENT_OTHER): Payer: Self-pay | Admitting: Surgery

## 2023-11-11 ENCOUNTER — Telehealth: Payer: Self-pay | Admitting: Genetic Counselor

## 2023-11-11 ENCOUNTER — Ambulatory Visit
Admission: RE | Admit: 2023-11-11 | Discharge: 2023-11-11 | Disposition: A | Discharge status: Home / Self Care | Source: Ambulatory Visit | Attending: Surgery | Admitting: Surgery

## 2023-11-11 ENCOUNTER — Encounter (HOSPITAL_BASED_OUTPATIENT_CLINIC_OR_DEPARTMENT_OTHER)
Admission: RE | Disposition: A | Discharge status: Home / Self Care | Payer: Self-pay | Source: Home / Self Care | Attending: Surgery

## 2023-11-11 ENCOUNTER — Other Ambulatory Visit: Payer: Self-pay

## 2023-11-11 ENCOUNTER — Ambulatory Visit (HOSPITAL_BASED_OUTPATIENT_CLINIC_OR_DEPARTMENT_OTHER): Admitting: Certified Registered"

## 2023-11-11 ENCOUNTER — Ambulatory Visit (HOSPITAL_BASED_OUTPATIENT_CLINIC_OR_DEPARTMENT_OTHER)
Admission: RE | Admit: 2023-11-11 | Discharge: 2023-11-11 | Disposition: A | Discharge status: Home / Self Care | Attending: Surgery | Admitting: Surgery

## 2023-11-11 ENCOUNTER — Encounter: Payer: Self-pay | Admitting: Genetic Counselor

## 2023-11-11 DIAGNOSIS — Z1721 Progesterone receptor positive status: Secondary | ICD-10-CM | POA: Insufficient documentation

## 2023-11-11 DIAGNOSIS — D0512 Intraductal carcinoma in situ of left breast: Secondary | ICD-10-CM | POA: Diagnosis not present

## 2023-11-11 DIAGNOSIS — Z170 Estrogen receptor positive status [ER+]: Secondary | ICD-10-CM | POA: Insufficient documentation

## 2023-11-11 DIAGNOSIS — Z79899 Other long term (current) drug therapy: Secondary | ICD-10-CM | POA: Diagnosis not present

## 2023-11-11 DIAGNOSIS — D0511 Intraductal carcinoma in situ of right breast: Secondary | ICD-10-CM

## 2023-11-11 DIAGNOSIS — Z1379 Encounter for other screening for genetic and chromosomal anomalies: Secondary | ICD-10-CM | POA: Insufficient documentation

## 2023-11-11 DIAGNOSIS — I10 Essential (primary) hypertension: Secondary | ICD-10-CM | POA: Insufficient documentation

## 2023-11-11 DIAGNOSIS — Z01818 Encounter for other preprocedural examination: Secondary | ICD-10-CM

## 2023-11-11 DIAGNOSIS — Z1509 Genetic susceptibility to other malignant neoplasm: Secondary | ICD-10-CM | POA: Insufficient documentation

## 2023-11-11 SURGERY — BREAST LUMPECTOMY WITH RADIOACTIVE SEED LOCALIZATION
Anesthesia: General | Site: Breast | Laterality: Right

## 2023-11-11 MED ORDER — FENTANYL CITRATE (PF) 100 MCG/2ML IJ SOLN: 25.0000 ug | INTRAMUSCULAR | Status: DC | PRN

## 2023-11-11 MED ORDER — CEFAZOLIN SODIUM-DEXTROSE 2-4 GM/100ML-% IV SOLN
2.0000 g | INTRAVENOUS | Status: AC
  Administered 2023-11-11: 2 g via INTRAVENOUS

## 2023-11-11 MED ORDER — DEXAMETHASONE SOD PHOSPHATE PF 10 MG/ML IJ SOLN
INTRAMUSCULAR | Status: DC | PRN
  Administered 2023-11-11: 5 mg via INTRAVENOUS

## 2023-11-11 MED ORDER — 0.9 % SODIUM CHLORIDE (POUR BTL) OPTIME
TOPICAL | Status: DC | PRN
  Administered 2023-11-11: 200 mL

## 2023-11-11 MED ORDER — FENTANYL CITRATE (PF) 100 MCG/2ML IJ SOLN
INTRAMUSCULAR | Status: DC | PRN
  Administered 2023-11-11 (×4): 25 ug via INTRAVENOUS

## 2023-11-11 MED ORDER — AMISULPRIDE (ANTIEMETIC) 5 MG/2ML IV SOLN: 10.0000 mg | Freq: Once | INTRAVENOUS | Status: DC | PRN

## 2023-11-11 MED ORDER — OXYCODONE HCL 5 MG PO TABS: 5.0000 mg | ORAL_TABLET | Freq: Once | ORAL | Status: DC | PRN

## 2023-11-11 MED ORDER — FENTANYL CITRATE (PF) 100 MCG/2ML IJ SOLN
INTRAMUSCULAR | Status: AC
  Filled 2023-11-11: qty 2

## 2023-11-11 MED ORDER — OXYCODONE HCL 5 MG PO TABS: 5.0000 mg | ORAL_TABLET | Freq: Four times a day (QID) | ORAL | 0 refills | Status: DC | PRN

## 2023-11-11 MED ORDER — LACTATED RINGERS IV SOLN
INTRAVENOUS | Status: DC
Start: 2023-11-11 — End: 2023-11-11

## 2023-11-11 MED ORDER — EPHEDRINE SULFATE (PRESSORS) 25 MG/5ML IV SOSY
PREFILLED_SYRINGE | INTRAVENOUS | Status: DC | PRN
  Administered 2023-11-11: 5 mg via INTRAVENOUS

## 2023-11-11 MED ORDER — CHLORHEXIDINE GLUCONATE CLOTH 2 % EX PADS: 6.0000 | MEDICATED_PAD | Freq: Once | CUTANEOUS | Status: DC

## 2023-11-11 MED ORDER — PROPOFOL 10 MG/ML IV BOLUS
INTRAVENOUS | Status: DC | PRN
  Administered 2023-11-11: 200 mg via INTRAVENOUS

## 2023-11-11 MED ORDER — OXYCODONE HCL 5 MG/5ML PO SOLN: 5.0000 mg | Freq: Once | ORAL | Status: DC | PRN

## 2023-11-11 MED ORDER — IBUPROFEN 800 MG PO TABS: 800.0000 mg | ORAL_TABLET | Freq: Three times a day (TID) | ORAL | 0 refills | Status: DC | PRN

## 2023-11-11 MED ORDER — LIDOCAINE HCL (CARDIAC) PF 100 MG/5ML IV SOSY
PREFILLED_SYRINGE | INTRAVENOUS | Status: DC | PRN
  Administered 2023-11-11: 60 mg via INTRAVENOUS

## 2023-11-11 MED ORDER — BUPIVACAINE-EPINEPHRINE (PF) 0.25% -1:200000 IJ SOLN
INTRAMUSCULAR | Status: DC | PRN
  Administered 2023-11-11: 20 mL

## 2023-11-11 MED ORDER — ONDANSETRON HCL 4 MG/2ML IJ SOLN
INTRAMUSCULAR | Status: DC | PRN
  Administered 2023-11-11: 4 mg via INTRAVENOUS

## 2023-11-11 MED ORDER — CEFAZOLIN SODIUM-DEXTROSE 2-4 GM/100ML-% IV SOLN
INTRAVENOUS | Status: AC
  Filled 2023-11-11: qty 100

## 2023-11-11 SURGICAL SUPPLY — 41 items
BINDER BREAST LRG (GAUZE/BANDAGES/DRESSINGS) IMPLANT
BINDER BREAST MEDIUM (GAUZE/BANDAGES/DRESSINGS) IMPLANT
BINDER BREAST XLRG (GAUZE/BANDAGES/DRESSINGS) IMPLANT
BINDER BREAST XXLRG (GAUZE/BANDAGES/DRESSINGS) IMPLANT
BLADE SURG 15 STRL LF DISP TIS (BLADE) ×1 IMPLANT
CANISTER SUC SOCK COL 7IN (MISCELLANEOUS) IMPLANT
CANISTER SUCT 1200ML W/VALVE (MISCELLANEOUS) IMPLANT
CHLORAPREP W/TINT 26 (MISCELLANEOUS) ×1 IMPLANT
CLIP APPLIE 9.375 MED OPEN (MISCELLANEOUS) IMPLANT
COVER BACK TABLE 60X90IN (DRAPES) ×1 IMPLANT
COVER MAYO STAND STRL (DRAPES) ×1 IMPLANT
COVER PROBE CYLINDRICAL 5X96 (MISCELLANEOUS) ×1 IMPLANT
DERMABOND ADVANCED .7 DNX12 (GAUZE/BANDAGES/DRESSINGS) ×1 IMPLANT
DRAPE LAPAROSCOPIC ABDOMINAL (DRAPES) IMPLANT
DRAPE LAPAROTOMY 100X72 PEDS (DRAPES) ×1 IMPLANT
DRAPE UTILITY XL STRL (DRAPES) ×1 IMPLANT
ELECT COATED BLADE 2.86 ST (ELECTRODE) ×1 IMPLANT
ELECTRODE REM PT RTRN 9FT ADLT (ELECTROSURGICAL) ×1 IMPLANT
GLOVE BIOGEL PI IND STRL 8 (GLOVE) ×1 IMPLANT
GLOVE ECLIPSE 8.0 STRL XLNG CF (GLOVE) ×1 IMPLANT
GOWN STRL REUS W/ TWL LRG LVL3 (GOWN DISPOSABLE) ×2 IMPLANT
GOWN STRL REUS W/ TWL XL LVL3 (GOWN DISPOSABLE) ×1 IMPLANT
HEMOSTAT ARISTA ABSORB 3G PWDR (HEMOSTASIS) IMPLANT
HEMOSTAT SNOW SURGICEL 2X4 (HEMOSTASIS) IMPLANT
KIT MARKER MARGIN INK (KITS) ×1 IMPLANT
NDL HYPO 25X1 1.5 SAFETY (NEEDLE) ×1 IMPLANT
NEEDLE HYPO 25X1 1.5 SAFETY (NEEDLE) ×1 IMPLANT
NS IRRIG 1000ML POUR BTL (IV SOLUTION) ×1 IMPLANT
PACK BASIN DAY SURGERY FS (CUSTOM PROCEDURE TRAY) ×1 IMPLANT
PENCIL SMOKE EVACUATOR (MISCELLANEOUS) ×1 IMPLANT
SLEEVE SCD COMPRESS KNEE MED (STOCKING) ×1 IMPLANT
SPIKE FLUID TRANSFER (MISCELLANEOUS) IMPLANT
SPONGE T-LAP 4X18 ~~LOC~~+RFID (SPONGE) ×1 IMPLANT
SUT MNCRL AB 4-0 PS2 18 (SUTURE) ×1 IMPLANT
SUT SILK 2 0 SH (SUTURE) IMPLANT
SUT VICRYL 3-0 CR8 SH (SUTURE) ×1 IMPLANT
SYR CONTROL 10ML LL (SYRINGE) ×1 IMPLANT
TOWEL GREEN STERILE FF (TOWEL DISPOSABLE) ×1 IMPLANT
TRAY FAXITRON CT DISP (TRAY / TRAY PROCEDURE) ×1 IMPLANT
TUBE CONNECTING 20X1/4 (TUBING) IMPLANT
YANKAUER SUCT BULB TIP NO VENT (SUCTIONS) IMPLANT

## 2023-11-11 NOTE — Interval H&P Note (Signed)
 History and Physical Interval Note:  11/11/2023 10:51 AM  Colleen Wiley  has presented today for surgery, with the diagnosis of DUCTAL CARCINOMA IN SITU RIGHT BREAST.  The various methods of treatment have been discussed with the patient and family. After consideration of risks, benefits and other options for treatment, the patient has consented to  Procedure(s): BREAST LUMPECTOMY WITH RADIOACTIVE SEED LOCALIZATION (Right) as a surgical intervention.  The patient's history has been reviewed, patient examined, no change in status, stable for surgery.  I have reviewed the patient's chart and labs.  Questions were answered to the patient's satisfaction.     Yasmine Kilbourne A Shaylea Ucci

## 2023-11-11 NOTE — Anesthesia Preprocedure Evaluation (Signed)
 Anesthesia Evaluation  Patient identified by MRN, date of birth, ID band Patient awake    Reviewed: Allergy & Precautions, NPO status , Patient's Chart, lab work & pertinent test results  Airway Mallampati: II  TM Distance: >3 FB Neck ROM: Full    Dental  (+) Dental Advisory Given   Pulmonary neg pulmonary ROS   breath sounds clear to auscultation       Cardiovascular hypertension, Pt. on medications  Rhythm:Regular Rate:Normal     Neuro/Psych negative neurological ROS     GI/Hepatic negative GI ROS, Neg liver ROS,,,  Endo/Other  negative endocrine ROS    Renal/GU negative Renal ROS     Musculoskeletal  (+) Arthritis ,    Abdominal   Peds  Hematology negative hematology ROS (+)   Anesthesia Other Findings   Reproductive/Obstetrics                              Anesthesia Physical Anesthesia Plan  ASA: 2  Anesthesia Plan: General   Post-op Pain Management: Ofirmev  IV (intra-op)* and Toradol IV (intra-op)*   Induction: Intravenous  PONV Risk Score and Plan: 3 and Dexamethasone , Ondansetron  and Treatment may vary due to age or medical condition  Airway Management Planned: LMA  Additional Equipment: None  Intra-op Plan:   Post-operative Plan: Extubation in OR  Informed Consent: I have reviewed the patients History and Physical, chart, labs and discussed the procedure including the risks, benefits and alternatives for the proposed anesthesia with the patient or authorized representative who has indicated his/her understanding and acceptance.     Dental advisory given  Plan Discussed with: CRNA  Anesthesia Plan Comments:         Anesthesia Quick Evaluation

## 2023-11-11 NOTE — Anesthesia Procedure Notes (Signed)
 Procedure Name: LMA Insertion Date/Time: 11/11/2023 10:58 AM  Performed by: Debarah Chiquita LABOR, CRNAPre-anesthesia Checklist: Patient identified, Emergency Drugs available, Suction available and Patient being monitored Patient Re-evaluated:Patient Re-evaluated prior to induction Oxygen Delivery Method: Circle system utilized Preoxygenation: Pre-oxygenation with 100% oxygen Induction Type: IV induction Ventilation: Mask ventilation without difficulty LMA: LMA inserted LMA Size: 4.0 Number of attempts: 1 Airway Equipment and Method: Bite block Placement Confirmation: positive ETCO2 Tube secured with: Tape Dental Injury: Teeth and Oropharynx as per pre-operative assessment

## 2023-11-11 NOTE — H&P (Signed)
 History of Present Illness: Colleen Wiley is a 73 y.o. female who is seen today as an office consultation for evaluation of Breast Cancer  The patient presents to the [facility] for evaluation of mammographic abnormality right breast. She had a 1.7 cm cluster of microcalcifications core biopsy proven to be intermediate grade DCIS. No history of breast pain or discharge. 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  Review of Systems: A complete review of systems was obtained from the patient. I have reviewed this information and discussed as appropriate with the patient. See HPI as well for other ROS.    Medical History: Past Medical History:  Diagnosis Date  Arthritis  Hypertension   Patient Active Problem List  Diagnosis  Allergic rhinitis  Ductal carcinoma in situ (DCIS) of right breast  History of total hip replacement  Hyperlipidemia  Hypertension  Lumbosacral spondylosis without myelopathy  Monoallelic mutation of ATM gene  Obesity  Osteopenia  Primary localized osteoarthritis of pelvic region and thigh  Rosacea   Past Surgical History:  Procedure Laterality Date  Total hip arthroplasty (Left) Left 04/18/2023  Total hip arthroplasty (Right Right 06/13/2023  Breast biopsy (Right) Right 10/15/2023  CATARACT EXTRACTION W/ INTRAOCULAR LENS IMPLANT & ANTERIOR VITRECTOMY, BILATERAL Bilateral  Date Unknown  COLONOSCOPY N/A  Date Unknown    No Known Allergies  Current Outpatient Medications on File Prior to Visit  Medication Sig Dispense Refill  amLODIPine  (NORVASC ) 5 MG tablet Take 5 mg by mouth once daily  aspirin  81 MG chewable tablet Take 81 mg by mouth 2 (two) times daily  chlorhexidine  (HIBICLENS ) 4 % external wash Apply 1 Application topically as directed Apply 15 mLs (1 Application total) topically as directed for 30 doses. Use as directed daily for 5 days every other week for 6 weeks.  hydroCHLOROthiazide  (HYDRODIURIL ) 25 MG tablet Take 25 mg by mouth once daily Take 25 mg by  mouth in the morning  potassium chloride  (KLOR-CON ) 10 MEQ ER tablet Take 10 mEq by mouth once daily Take 10 mEq by mouth in the morning.   No current facility-administered medications on file prior to visit.   Family History  Problem Relation Age of Onset  Breast cancer Mother  Obesity Father  High blood pressure (Hypertension) Father  Diabetes Father  Prostate cancer Brother  Melanoma Brother  Pancreatic cancer Maternal Uncle  Ovarian cancer Maternal Grandmother  Cancer Other    Social History   Tobacco Use  Smoking Status Never  Smokeless Tobacco Never    Social History   Socioeconomic History  Marital status: Married  Tobacco Use  Smoking status: Never  Smokeless tobacco: Never  Vaping Use  Vaping status: Never Used  Substance and Sexual Activity  Drug use: Never   Social Drivers of Health   Food Insecurity: No Food Insecurity (06/13/2023)  Received from [facility]  Hunger Vital Sign  Within the past 12 months, you worried that your food would run out before you got the money to buy more.: Never true  Within the past 12 months, the food you bought just didn't last and you didn't have money to get more.: Never true  Transportation Needs: No Transportation Needs (06/13/2023)  Received from [facility] - Transportation  Lack of Transportation (Medical): No  Lack of Transportation (Non-Medical): No  Social Connections: Moderately Integrated (06/13/2023)  Received from [facility]  Social Connection and Isolation Panel  In a typical week, how many times do you talk on the phone with family, friends, or  neighbors?: More than three times a week  How often do you get together with friends or relatives?: More than three times a week  How often do you attend church or religious services?: Never  Do you belong to any clubs or organizations such as church groups, unions, fraternal or athletic groups, or school groups?: Yes  How often do you attend meetings of  the clubs or organizations you belong to?: More than 4 times per year  Are you married, widowed, divorced, separated, never married, or living with a partner?: Married   Objective:  There were no vitals filed for this visit.  There is no height or weight on file to calculate BMI.  Physical Exam Exam conducted with a chaperone present.  Cardiovascular:  Rate and Rhythm: Normal rate.  Pulmonary:  Effort: Pulmonary effort is normal.  Chest:  Breasts: Right: Normal. No mass.  Left: Normal. No mass.   Comments: Mild bruising right breast upper outer quadrant Musculoskeletal:  Cervical back: Normal range of motion.  Lymphadenopathy:  Upper Body:  Right upper body: No axillary adenopathy.  Left upper body: No axillary adenopathy.  Neurological:  Mental Status: She is alert.     Labs, Imaging and Diagnostic Testing:  FINAL DIAGNOSIS   1. Breast, right, needle core biopsy, UOQ :  - DUCTAL CARCINOMA IN SITU, INTERMEDIATE GRADE  - NECROSIS: PRESENT  - CALCIFICATIONS: PRESENT  - DCIS LENGTH: 0.2 CM   Diagnosis Note : Dr. LeGolvan reviewed the case and concurs with the above  diagnosis.. A breast prognostic profile (ER and PR) is pending and will be  reported in an addendum. [facility] was notified  on 10/16/2023.   DATE SIGNED OUT: 10/16/2023  ELECTRONIC SIGNATURE : Rebbecca Md, Nilesh, Pathologist, Electronic Signature   MICROSCOPIC DESCRIPTION   CASE COMMENTS  STAINS USED IN DIAGNOSIS:  H&E-2  H&E-3  H&E-4  H&E  H&E-2  H&E-3  H&E-4  H&E  *RECUT 1 SLIDE  Stains used in diagnosis 1 ER-ACIS, 1 PR-ACIS  Estrogen receptor (6F11), immunohistochemical stains are performed on formalin  fixed, paraffin embedded tissue using a 3,3-diaminobenzidine (DAB) chromogen  and Leica Bond Autostainer System. The staining intensity of the nucleus is  scored manually and is reported as the percentage of tumor cell nuclei  demonstrating specific nuclear  staining.Specimens are fixed in 10% Neutral  Buffered Formalin for at least 6 hours and up to 72 hours. These tests have not  be validated on decalcified tissue. Results should be interpreted with caution  given the possibility of false negative results on decalcified specimens.  PR progesterone receptor (16), immunohistochemical stains are performed on  formalin fixed, paraffin embedded tissue using a 3,3-diaminobenzidine (DAB)  chromogen and Leica Bond Autostainer System. The staining intensity of the  nucleus is scored manually and is reported as the percentage of tumor cell  nuclei demonstrating specific nuclear staining.Specimens are fixed in 10%  Neutral Buffered Formalin for at least 6 hours and up to 72 hours. These tests  have not be validated on decalcified tissue. Results should be interpreted with  caution given the possibility of false negative results on decalcified  specimens.   ADDENDUM  Breast, right, needle core biopsy  PROGNOSTIC INDICATORS  Results:  IMMUNOHISTOCHEMICAL AND MORPHOMETRIC ANALYSIS PERFORMED MANUALLY  Estrogen Receptor: 100%, positive, strong staining intensity  Progesterone Receptor: 10%, positive, moderate to strong staining intensity  COMMENT: The negative hormone receptor study(ies) in this case has an internal positive control.   REFERENCE  RANGE ESTROGEN RECEPTOR  NEGATIVE 0%  POSITIVE =>1%  REFERENCE RANGE PROGESTERONE RECEPTOR  NEGATIVE 0%  POSITIVE =>1%  All controls stained appropriately  Pepper Dutton Md, Pathologist, Electronic Signature  ( Signed [phone] [id number])   CLINICAL HISTORY   SPECIMEN(S) OBTAINED  1. Breast, right, needle core biopsy, UOQ   SPECIMEN COMMENTS:  1. TIF: 9 AM, CIT < 5 min; calcifications  SPECIMEN CLINICAL INFORMATION:  1. R/O DCIS vs benign  Assessment and Plan:   Diagnoses and all orders for this visit:  Ductal carcinoma in situ (DCIS) of right breast   Reviewed treatment options to consist of breast  conserving surgery plus mastectomy with reconstruction. She has opted for right breast seed lumpectomy. Risks and benefits reviewed.  Discussed her ATM status as well and reviewed the NCCN guidelines for management.  Plan for right breast seed lumpectomy.The procedure has been discussed with the patient. Alternatives to surgery have been discussed with the patient. Risks of surgery include bleeding, Infection, Seroma formation, death, and the need for further surgery. The patient understands and wishes to proceed.    DEBBY CURTISTINE SHIPPER, MD

## 2023-11-11 NOTE — Op Note (Signed)
 Preoperative diagnosis: Right breast DCIS upper outer quadrant  Postoperative diagnosis: Same  Procedure: Right breast seed localized lumpectomy  Surgeon: Debby Shipper, MD  Anesthesia: LMA with 0.25% Marcaine  with epinephrine  EBL: Minimal  Specimen: Right breast tissue with seed and clip verified by Faxitron  Drains: None  Indications for procedure: The patient is a 73 year old female with right breast DCIS.  She was evaluated in a multidisciplinary clinic setting and opted for breast conserving surgery after reviewing all of her options.The procedure has been discussed with the patient. Alternatives to surgery have been discussed with the patient.  Risks of surgery include bleeding,  Infection, cosmesis, seroma formation, death,  and the need for further surgery.   The patient understands and wishes to proceed.    Description of procedure: The patient was met in the holding area and questions were answered.  The right breast was marked as the correct site and the seed was placed as an outpatient by radiology.  Films were available for review.  She was taken back to the operating room and placed supine upon the operating table.  After induction of general anesthesia, the right breast was prepped and draped in a sterile fashion and timeout performed.  Proper patient, site and procedure verified.  The neoprobe was used to identify the seed in the right breast upper outer quadrant.  A curvilinear incision was made and dissection was carried through the subcutaneous tissues into the breast tissue to encircled the seed and clip.  This was excised with grossly negative margins.  This was oriented with ink.  Imaging revealed the seed and clip to be present.  This was sent to pathology.  The cavity was then irrigated.  Local anesthetic infiltrated.  Hemostasis achieved with cautery.  The breast was closed in layers with deep layer of 3-0 Vicryl.  4-0 Monocryl was used to close the skin in a  subcuticular fashion.  Dermabond applied.  All counts found to be correct.  Breast binder placed.  The patient was awoke extubated taken to recovery in satisfactory condition.

## 2023-11-11 NOTE — Transfer of Care (Signed)
 Immediate Anesthesia Transfer of Care Note  Patient: Colleen Wiley  Procedure(s) Performed: BREAST LUMPECTOMY WITH RADIOACTIVE SEED LOCALIZATION (Right: Breast)  Patient Location: PACU  Anesthesia Type:General  Level of Consciousness: sedated  Airway & Oxygen Therapy: Patient Spontanous Breathing  Post-op Assessment: Report given to RN and Post -op Vital signs reviewed and stable  Post vital signs: Reviewed and stable  Last Vitals:  Vitals Value Taken Time  BP 126/88 11/11/23 11:55  Temp    Pulse 85 11/11/23 11:56  Resp 18 11/11/23 11:56  SpO2 91 % 11/11/23 11:56  Vitals shown include unfiled device data.  Last Pain:  Vitals:   11/11/23 1043  TempSrc: Temporal  PainSc: 0-No pain         Complications: No notable events documented.

## 2023-11-11 NOTE — Anesthesia Postprocedure Evaluation (Signed)
 Anesthesia Post Note  Patient: Colleen Wiley  Procedure(s) Performed: BREAST LUMPECTOMY WITH RADIOACTIVE SEED LOCALIZATION (Right: Breast)     Patient location during evaluation: PACU Anesthesia Type: General Level of consciousness: awake and alert Pain management: pain level controlled Vital Signs Assessment: post-procedure vital signs reviewed and stable Respiratory status: spontaneous breathing, nonlabored ventilation, respiratory function stable and patient connected to nasal cannula oxygen Cardiovascular status: blood pressure returned to baseline and stable Postop Assessment: no apparent nausea or vomiting Anesthetic complications: no   No notable events documented.  Last Vitals:  Vitals:   11/11/23 1230 11/11/23 1301  BP: 111/78 128/88  Pulse: 71 72  Resp: 20 16  Temp:  (!) 36.4 C  SpO2: 94% 95%    Last Pain:  Vitals:   11/11/23 1301  TempSrc: Temporal  PainSc: 0-No pain                 Epifanio Lamar BRAVO

## 2023-11-11 NOTE — Telephone Encounter (Signed)
 Patient tested positive for the known ATM mutation, but all other testing was negative.  Patient qualified for additional testing based on family history of melanoma and pancreatic cancer.  Discussed that once she is on the other side of her breast cancer diagnosis a referral for pancreatic cancer screening is recommended.  We will mention this to Dr. Loretha.  There are no current recommendations for ovarian cancer risks and colon cancer risks at this time, except manage based on family history.   The test report will be scanned into EPIC and will be located under the Molecular Pathology section of the Results Review tab.  A portion of the result report is included below for reference.

## 2023-11-11 NOTE — Discharge Instructions (Addendum)
[facility] Office Phone Number [phone]  BREAST BIOPSY/ PARTIAL MASTECTOMY: POST OP INSTRUCTIONS  Always review your discharge instruction sheet given to you by the facility where your surgery was performed.  IF YOU HAVE DISABILITY OR FAMILY LEAVE FORMS, YOU MUST BRING THEM TO THE OFFICE FOR PROCESSING.  DO NOT GIVE THEM TO YOUR DOCTOR.  A prescription for pain medication may be given to you upon discharge.  Take your pain medication as prescribed, if needed.  If narcotic pain medicine is not needed, then you may take acetaminophen (Tylenol) or ibuprofen (Advil) as needed. Take your usually prescribed medications unless otherwise directed If you need a refill on your pain medication, please contact your pharmacy.  They will contact our office to request authorization.  Prescriptions will not be filled after 5pm or on week-ends. You should eat very light the first 24 hours after surgery, such as soup, crackers, pudding, etc.  Resume your normal diet the day after surgery. Most patients will experience some swelling and bruising in the breast.  Ice packs and a good support bra will help.  Swelling and bruising can take several days to resolve.  It is common to experience some constipation if taking pain medication after surgery.  Increasing fluid intake and taking a stool softener will usually help or prevent this problem from occurring.  A mild laxative (Milk of Magnesia or Miralax) should be taken according to package directions if there are no bowel movements after 48 hours. Unless discharge instructions indicate otherwise, you may remove your bandages 24-48 hours after surgery, and you may shower at that time.  You may have steri-strips (small skin tapes) in place directly over the incision.  These strips should be left on the skin for 7-10 days.  If your surgeon used skin glue on the incision, you may shower in 24 hours.  The glue will flake off over the next 2-3 weeks.  Any  sutures or staples will be removed at the office during your follow-up visit. ACTIVITIES:  You may resume regular daily activities (gradually increasing) beginning the next day.  Wearing a good support bra or sports bra minimizes pain and swelling.  You may have sexual intercourse when it is comfortable. You may drive when you no longer are taking prescription pain medication, you can comfortably wear a seatbelt, and you can safely maneuver your car and apply brakes. RETURN TO WORK:  ______________________________________________________________________________________ You should see your doctor in the office for a follow-up appointment approximately two weeks after your surgery.  Your doctor's nurse will typically make your follow-up appointment when she calls you with your pathology report.  Expect your pathology report 2-3 business days after your surgery.  You may call to check if you do not hear from us after three days. OTHER INSTRUCTIONS: _______________________________________________________________________________________________ _____________________________________________________________________________________________________________________________________ _____________________________________________________________________________________________________________________________________ _____________________________________________________________________________________________________________________________________  WHEN TO CALL YOUR DOCTOR: Fever over 101.0 Nausea and/or vomiting. Extreme swelling or bruising. Continued bleeding from incision. Increased pain, redness, or drainage from the incision.  The clinic staff is available to answer your questions during regular business hours.  Please don't hesitate to call and ask to speak to one of the nurses for clinical concerns.  If you have a medical emergency, go to the nearest emergency room or call 911.  A surgeon from [facility] is always on call at the hospital.  For further questions, please visit [email or web address]    Post Anesthesia Home Care Instructions  Activity: Get plenty of rest for the remainder of   the day. A responsible individual must stay with you for 24 hours following the procedure.  For the next 24 hours, DO NOT: -Drive a car -Operate machinery -Drink alcoholic beverages -Take any medication unless instructed by your physician -Make any legal decisions or sign important papers.  Meals: Start with liquid foods such as gelatin or soup. Progress to regular foods as tolerated. Avoid greasy, spicy, heavy foods. If nausea and/or vomiting occur, drink only clear liquids until the nausea and/or vomiting subsides. Call your physician if vomiting continues.  Special Instructions/Symptoms: Your throat may feel dry or sore from the anesthesia or the breathing tube placed in your throat during surgery. If this causes discomfort, gargle with warm salt water. The discomfort should disappear within 24 hours.  If you had a scopolamine patch placed behind your ear for the management of post- operative nausea and/or vomiting:  1. The medication in the patch is effective for 72 hours, after which it should be removed.  Wrap patch in a tissue and discard in the trash. Wash hands thoroughly with soap and water. 2. You may remove the patch earlier than 72 hours if you experience unpleasant side effects which may include dry mouth, dizziness or visual disturbances. 3. Avoid touching the patch. Wash your hands with soap and water after contact with the patch.      

## 2023-11-12 ENCOUNTER — Encounter (HOSPITAL_BASED_OUTPATIENT_CLINIC_OR_DEPARTMENT_OTHER): Payer: Self-pay | Admitting: Surgery

## 2023-11-13 ENCOUNTER — Ambulatory Visit: Payer: Self-pay | Admitting: Genetic Counselor

## 2023-11-13 ENCOUNTER — Encounter: Payer: Self-pay | Admitting: *Deleted

## 2023-11-13 DIAGNOSIS — Z1501 Genetic susceptibility to malignant neoplasm of breast: Secondary | ICD-10-CM

## 2023-11-13 DIAGNOSIS — Z1379 Encounter for other screening for genetic and chromosomal anomalies: Secondary | ICD-10-CM

## 2023-11-13 NOTE — Progress Notes (Signed)
 HPI:  [name] was previously seen in the [facility] due to a personal and family history of cancer, a known personal history of ATM pathogenic mutation and concerns regarding a potentially second hereditary predisposition to cancer. Please refer to our prior cancer genetics clinic note for more information regarding our discussion, assessment and recommendations, at the time. [name] recent genetic test results were disclosed to her, as were recommendations warranted by these results. These results and recommendations are discussed in more detail below.  CANCER HISTORY:  Oncology History  Ductal carcinoma in situ (DCIS) of right breast  10/02/2023 Mammogram   In the right breast, calcifications warrant further evaluation with magnified views. In the left breast, no findings suspicious for malignancy. Stereotactic biopsy of the calcifications in the upper-outer quadrant of the right breast is recommended.   10/15/2023 Pathology Results   1. 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 Report date is 11/11/2023  The [facility] CustomNext-cancer+RNAinsight Panel includes sequencing, rearrangement analysis, and RNA analysis for the following 44 genes:  APC, ATM, BAP1, BARD1, BMPR1A, BRCA1, BRCA2, BRIP1, CDH1, CDK4, CDKN2A, CHEK2, FH, FLCN, MET, MLH1, MSH2, MSH6, MUTYH, NF1, NTHL1, PALB2, PMS2, POT1, PTEN, RAD51C, RAD51D, RB1, RPS20, SMAD4, STK11, TP53, TSC1, TSC2 and VHL (sequencing and deletion/duplication); AXIN2, HOXB13, MBD4,  MITF, MSH3, POLD1 and POLE (sequencing only); EPCAM and GREM1 (deletion/duplication only). RNA data is routinely analyzed for use in variant interpretation for all genes.     FAMILY HISTORY:  We obtained a detailed, 4-generation family history.  Significant diagnoses are listed below: Family History  Problem Relation Age of Onset   Breast cancer Mother 72   Cancer Mother 75       neuroendocrine tumor of pancrease    Prostate cancer Brother    Melanoma Brother        (23y, 87y and 57y   Pancreatic cancer Maternal Uncle    Pancreatic cancer Maternal Uncle    Cancer Paternal Uncle        NOS   Breast cancer Cousin 40       maternal first cousin   Cancer Cousin        lung cancer   Uterine cancer Cousin        pat cousin   Ovarian cancer Other    Cancer Other        unknown type of cancer        The patient does not have children.  She has two brothers, one who had prostate cancer and has the ATM variant and the other who had melanoma 3x and is ATM negative.  Both parents are deceased.   The patient's father had a brother with an unknown cancer and a sister who had a daughter who had uterine cancer.   The patient's mother had breast cancer and PNET.  She had two brothers with pancreatic cancer and a one brother had two daughters with breast cancer.  The maternal great  aunt had ovarian cancer.   [name] is unaware of previous family history of genetic testing for hereditary cancer risks. There is no reported Ashkenazi Jewish ancestry. There is no known consanguinity.  GENETIC TEST RESULTS: Genetic testing reported out on November 11, 2023 through the CustomNext-Cancer panel found the known single pathogenic mutation in ATM called [phone]. Otherwise, the genetic testing was negative and did not identify additional hereditary cancer concerns. The test report has been scanned into EPIC and is located under the Molecular Pathology section of the Results Review tab.  A portion  of the result report is included below for reference.       Please see the genetic counseling note from October 22, 2023.    ADDITIONAL GENETIC TESTING: We discussed with [name] that her genetic testing was fairly extensive.  If there are genes identified to increase cancer risk that can be analyzed in the future, we would be happy to discuss and coordinate this testing at that time.    CANCER SCREENING RECOMMENDATIONS: Management for individuals with ATM mutations can be found in the NCCN guidelines (v1.2026).  These guidelines recommend the following:   Breast Cancer    Absolute risk: 21-24% Screening: Annual mammogram with consideration of tomosynthesis at age 28 and consider breast MRI with contrast starting at age 37 - 41 years. Risk Reducing Mastectomy: Evidence insufficient, consider based on family history Contralateral 10 year breast cancer risk: 4%   Ovarian Cancer   Absolute risk: 2-3% Evidence is insufficient for risk-reducing salpingo-oophorectomy (RRSO).  This risk should be managed by family history.   Pancreatic Cancer    Absolute risk: ~5-10% Consider screening starting at age 70 years (or 10 years younger than the earliest exocrine pancreatic cancer diagnosis in the family, whichever is earlier) for individuals with exocrine pancreatic cancer in >=1 first- or second-degree relatives from the same side of (or presumed to be from the same side of) the family as the identified P/LP germline variant   Prostate Cancer   Emerging evidence for association with increased risk. Consider prostate cancer screening starting at age 108.   Colon Cancer   Estimated Absolute risk: ~5-10% Evidence insufficient to provide specialized CRC screening recommendations, manage based on family history     This information is based on current understanding of the gene and may change in the future.     FAMILY MEMBERS: Hereditary predisposition to cancer due to pathogenic variants  in the ATM gene has autosomal dominant inheritance. This means that an individual with a pathogenic variant has a 50% chance of passing the condition on to their offspring. Once a pathogenic mutation is detected in an individual, it is possible to identify at-risk relatives who can pursue testing for this specific familial variant. Many cases are inherited from a parent, but some cases may occur spontaneously (i.e., an individual with a pathogenic variant who has parents who do not have it).   Individuals with a single pathogenic ATM variant are also carriers of autosomal recessive Ataxia Telangiectasia. Ataxia Telangiectasia is characterized by childhood onset of progressive neurologic manifestations, immunodeficiency, pulmonary disease,  and increased risk for cancer. For there to be a risk of Ataxia Telangiectasia in offspring, both the patient and their partner would each have to carry a pathogenic variant in ATM; in this case, the risk to have an affected child is 25%.It is important that all of [name]'s relatives (both men and women) know of the presence of this gene mutation. Site-specific genetic testing  can sort out who in the family is at risk and who is not. It is important that all of [name]'s relatives (both men and women) know of the presence of this gene mutation. Site-specific genetic testing can sort out who in the family is at risk and who is not.    [name] siblings have both had genetic testing.    FOLLOW-UP: Lastly, we discussed with [name] that cancer genetics is a rapidly advancing field and it is possible that new genetic tests will be appropriate for her and/or her family members in the future. We encouraged her to remain in contact with cancer genetics on an annual basis so we can update her personal and family histories and let her know of advances in cancer genetics that may benefit this family.   Our contact number was provided. [name] questions were  answered to her satisfaction, and she knows she is welcome to call us  at anytime with additional questions or concerns.   Darice Monte, MS, [facility] Licensed, Certified Genetic Counselor [email or web address]

## 2023-11-14 ENCOUNTER — Ambulatory Visit: Payer: Self-pay | Admitting: Surgery

## 2023-11-14 LAB — SURGICAL PATHOLOGY

## 2023-11-17 ENCOUNTER — Encounter: Payer: Self-pay | Admitting: Radiology

## 2023-11-24 ENCOUNTER — Telehealth: Payer: Self-pay

## 2023-11-24 NOTE — Telephone Encounter (Signed)
 Spoke with patient and confirmed appointment on 11/11

## 2023-11-25 ENCOUNTER — Inpatient Hospital Stay: Attending: Hematology and Oncology | Admitting: Hematology and Oncology

## 2023-11-25 VITALS — BP 141/79 | HR 89 | Temp 98.0°F | Resp 16 | Wt 194.3 lb

## 2023-11-25 DIAGNOSIS — Z1509 Genetic susceptibility to other malignant neoplasm: Secondary | ICD-10-CM

## 2023-11-25 DIAGNOSIS — D0511 Intraductal carcinoma in situ of right breast: Secondary | ICD-10-CM

## 2023-11-25 DIAGNOSIS — Z1501 Genetic susceptibility to malignant neoplasm of breast: Secondary | ICD-10-CM | POA: Diagnosis not present

## 2023-11-25 NOTE — Progress Notes (Signed)
 [facility] CONSULT NOTE  Patient Care Team: [name], FNP as PCP - General (Family Medicine) Gerome, Devere HERO, RN as Oncology Nurse Navigator Tyree Nanetta SAILOR, RN as Oncology Nurse Navigator Vanderbilt Ned, MD as Consulting Physician (General Surgery) [name], MD as Consulting Physician (Hematology and Oncology) Shannon Agent, MD as Consulting Physician (Radiation Oncology)  CHIEF COMPLAINTS/PURPOSE OF CONSULTATION:  Newly diagnosed breast cancer  HISTORY OF PRESENTING ILLNESS:  Colleen Wiley 73 y.o. female is here because of recent diagnosis of right breast DCIS  I reviewed her records extensively and collaborated the history with the patient.  SUMMARY OF ONCOLOGIC HISTORY: Oncology History  Ductal carcinoma in situ (DCIS) of right breast  10/02/2023 Mammogram   In the right breast, calcifications warrant further evaluation with magnified views. In the left breast, no findings suspicious for malignancy. Stereotactic biopsy of the calcifications in the upper-outer quadrant of the right breast is recommended.   10/15/2023 Pathology Results   1. 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 Report date is 11/11/2023  The [facility] CustomNext-cancer+RNAinsight Panel includes sequencing, rearrangement analysis, and RNA analysis for the following 44 genes:  APC, ATM, BAP1, BARD1, BMPR1A, BRCA1, BRCA2, BRIP1, CDH1, CDK4, CDKN2A, CHEK2, FH, FLCN, MET, MLH1, MSH2, MSH6, MUTYH, NF1, NTHL1, PALB2, PMS2, POT1, PTEN,  RAD51C, RAD51D, RB1, RPS20, SMAD4, STK11, TP53, TSC1, TSC2 and VHL (sequencing and deletion/duplication); AXIN2, HOXB13, MBD4, MITF, MSH3, POLD1 and POLE (sequencing only); EPCAM and GREM1 (deletion/duplication only). RNA data is routinely analyzed for use in variant interpretation for all genes.     Discussed the use of AI scribe software for clinical note transcription with the patient, who gave verbal consent to proceed.  History of Present Illness  Colleen Wiley is a 73 year old female with ductal carcinoma in situ of the right breast who presents for follow up after surgery.  She underwent a lumpectomy for ductal carcinoma in situ (DCIS) detected on a screening mammogram. The DCIS measured approximately one centimeter, and was of intermediate grade (grade 2) with clear margins and no necrosis.   The DCIS was estrogen and progesterone receptor-positive, which was known prior to the surgery. She discussed the options of radiation therapy and hormone therapy with a pill.  She has a family history of breast cancer, as her mother also had the condition. She postponed her flu and COVID vaccinations after the DCIS diagnosis but plans to schedule them now. She inquired about the timing of other routine screenings, such as dermatology and eye exams.  MEDICAL HISTORY:  Past Medical History:  Diagnosis Date   Arthritis    Family history of breast cancer    Family history of pancreatic cancer    Family history of prostate cancer    Hypertension     SURGICAL HISTORY: Past Surgical History:  Procedure Laterality Date   BREAST BIOPSY Right 10/15/2023   MM RT BREAST BX W LOC DEV 1ST LESION IMAGE BX SPEC STEREO  GUIDE 10/15/2023 [facility]MO[facility]   BREAST BIOPSY  11/07/2023   MM RT RADIOACTIVE SEED LOC MAMMO GUIDE 11/07/2023 [facility]AMMO[facility]   BREAST LUMPECTOMY WITH RADIOACTIVE SEED LOCALIZATION Right 11/11/2023   Procedure: BREAST LUMPECTOMY WITH RADIOACTIVE SEED LOCALIZATION;  Surgeon:  Vanderbilt Ned, MD;  Location: [facility];  Service: General;  Laterality: Right;   CATARACT EXTRACTION Bilateral    COLONOSCOPY     x2   TOTAL HIP ARTHROPLASTY Left 04/18/2023   Procedure: ARTHROPLASTY, HIP, TOTAL, ANTERIOR APPROACH;  Surgeon: Vernetta Lonni GRADE, MD;  Location: [facility];  Service: Orthopedics;  Laterality: Left;   TOTAL HIP ARTHROPLASTY Right 06/13/2023   Procedure: ARTHROPLASTY, HIP, TOTAL, ANTERIOR APPROACH;  Surgeon: Vernetta Lonni GRADE, MD;  Location: [facility];  Service: Orthopedics;  Laterality: Right;    SOCIAL HISTORY: Social History   Socioeconomic History   Marital status: Married    Spouse name: Not on file   Number of children: Not on file   Years of education: Not on file   Highest education level: Not on file  Occupational History   Not on file  Tobacco Use   Smoking status: Never   Smokeless tobacco: Never  Vaping Use   Vaping status: Never Used  Substance and Sexual Activity   Alcohol use: Not Currently    Comment: social drinker    Drug use: Never   Sexual activity: Yes  Other Topics Concern   Not on file  Social History Narrative   Not on file   Social Drivers of Health   Financial Resource Strain: Not on file  Food Insecurity: No Food Insecurity (10/22/2023)   Hunger Vital Sign    Worried About Running Out of Food in the Last Year: Never true    Ran Out of Food in the Last Year: Never true  Transportation Needs: No Transportation Needs (10/22/2023)   PRAPARE - Administrator, Civil Service (Medical): No    Lack of Transportation (Non-Medical): No  Physical Activity: Not on file  Stress: Not on file  Social Connections: Moderately Integrated (06/13/2023)   Social Connection and Isolation Panel    Frequency of Communication with Friends and Family: More than three times a week    Frequency of Social Gatherings with Friends and Family: More than three times a week    Attends Religious Services: Never     Database Administrator or Organizations: Yes    Attends Engineer, Structural: More than 4 times per year    Marital Status: Married  Catering Manager Violence: Not At Risk (10/22/2023)   Humiliation, Afraid, Rape, and Kick questionnaire    Fear of Current or Ex-Partner: No    Emotionally Abused: No    Physically Abused: No    Sexually Abused: No    FAMILY HISTORY: Family History  Problem Relation Age of Onset   Breast cancer Mother 24   Cancer Mother 36       neuroendocrine tumor of pancrease    Prostate cancer Brother    Melanoma Brother        (23y, 82y and 51y   Pancreatic cancer Maternal Uncle    Pancreatic cancer Maternal Uncle    Cancer Paternal Uncle        NOS   Breast cancer Cousin 71       maternal first cousin   Cancer Cousin        lung cancer   Uterine cancer Cousin  pat cousin   Ovarian cancer Other    Cancer Other        unknown type of cancer     ALLERGIES:  has no known allergies.  MEDICATIONS:  Current Outpatient Medications  Medication Sig Dispense Refill   amLODipine  (NORVASC ) 5 MG tablet Take 5 mg by mouth in the morning.     Cholecalciferol (VITAMIN D3 PO) Take 1 tablet by mouth 4 (four) times a week. Chewable     hydrochlorothiazide  (HYDRODIURIL ) 25 MG tablet Take 25 mg by mouth in the morning.     ibuprofen (ADVIL) 800 MG tablet Take 1 tablet (800 mg total) by mouth every 8 (eight) hours as needed. 30 tablet 0   oxyCODONE  (OXY IR/ROXICODONE ) 5 MG immediate release tablet Take 1 tablet (5 mg total) by mouth every 6 (six) hours as needed for severe pain (pain score 7-10). 15 tablet 0   potassium chloride  (KLOR-CON ) 10 MEQ tablet Take 10 mEq by mouth in the morning.     No current facility-administered medications for this visit.    REVIEW OF SYSTEMS:   Constitutional: Denies fevers, chills or abnormal night sweats Eyes: Denies blurriness of vision, double vision or watery eyes Ears, nose, mouth, throat, and face: Denies  mucositis or sore throat Respiratory: Denies cough, dyspnea or wheezes Cardiovascular: Denies palpitation, chest discomfort or lower extremity swelling Gastrointestinal:  Denies nausea, heartburn or change in bowel habits Skin: Denies abnormal skin rashes Lymphatics: Denies new lymphadenopathy or easy bruising Neurological:Denies numbness, tingling or new weaknesses Behavioral/Psych: Mood is stable, no new changes  Breast:  Denies any palpable lumps or discharge All other systems were reviewed with the patient and are negative.  PHYSICAL EXAMINATION: ECOG PERFORMANCE STATUS: 0 - Asymptomatic  Vitals:   11/25/23 1120  BP: (!) 141/79  Pulse: 89  Resp: 16  Temp: 98 F (36.7 C)  SpO2: 97%   Filed Weights   11/25/23 1120  Weight: 194 lb 4.8 oz (88.1 kg)    GENERAL:alert, no distress and comfortable  LABORATORY DATA:  I have reviewed the data as listed Lab Results  Component Value Date   WBC 11.0 (H) 10/22/2023   HGB 15.9 (H) 10/22/2023   HCT 45.6 10/22/2023   MCV 84.8 10/22/2023   PLT 377 10/22/2023   Lab Results  Component Value Date   NA 142 10/22/2023   K 3.4 (L) 10/22/2023   CL 102 10/22/2023   CO2 32 10/22/2023    RADIOGRAPHIC STUDIES: I have personally reviewed the radiological reports and agreed with the findings in the report.  ASSESSMENT AND PLAN:   This is a very pleasant 73 yr old female patient with newly diagnosed DCIS of right breast, ER positive, PR positive referred to hematology for additional recommendations.  Assessment and Plan Assessment & Plan Ductal carcinoma in situ (DCIS) of breast, post-lumpectomy, estrogen/progesterone receptor positive DCIS post-lumpectomy with clear margins, ER/PR positive, intermediate grade, no necrosis. No invasive cancer. Recurrence risk 9-14% without treatment. Recurrence risk with adj radiation is 4-6% at 5-10 yrs respectively using [facility] nomogram. Similar recurrence rate with adj anti estrogen therapy  only. Risk with both adj radiation and anti estrogen therapy is 2-3%. She decided to proceed with adj radiation only. - Proceed with adjuvant radiation therapy. - She doesn't want to consider antiestrogen therapy at this time.  Annual surveillance with  CEM given ATM mutation would be reasonable.  Planned influenza and COVID-19 vaccination Ok to receive immunization - Schedule and receive influenza and COVID-19 vaccinations.  Time spent: 30 min  All questions were answered. The patient knows to call the clinic with any problems, questions or concerns.    Amber Stalls, MD 11/25/23

## 2023-12-05 NOTE — Progress Notes (Signed)
 PROVIDER:  DEBBY CURTISTINE SHIPPER, MD  MRN: [id number] DOB: 1950-12-16 DATE OF ENCOUNTER: 12/05/2023 Subjective     Chief Complaint: Post Operative Visit     History of Present Illness: Colleen Wiley is a 73 y.o. female who is seen today for postop check 4 weeks after right breast lumpectomy for DCIS.  Margins are clear.  She is doing well..     Review of Systems: A complete review of systems was obtained from the patient.  I have reviewed this information and discussed as appropriate with the patient.  See HPI as well for other ROS.      Medical History: Past Medical History:  Diagnosis Date  . Arthritis   . Hypertension     Patient Active Problem List  Diagnosis  . Allergic rhinitis  . Ductal carcinoma in situ (DCIS) of right breast  . History of total hip replacement  . Hyperlipidemia  . Hypertension  . Lumbosacral spondylosis without myelopathy  . Monoallelic mutation of ATM gene  . Obesity  . Osteopenia  . Primary localized osteoarthritis of pelvic region and thigh  . Rosacea    Past Surgical History:  Procedure Laterality Date  . Total hip arthroplasty (Left) Left 04/18/2023  . Total hip arthroplasty (Right Right 06/13/2023  . Breast biopsy (Right) Right 10/15/2023  . CATARACT EXTRACTION W/ INTRAOCULAR LENS IMPLANT & ANTERIOR VITRECTOMY, BILATERAL Bilateral    Date Unknown  . COLONOSCOPY N/A    Date Unknown  . MASTECTOMY PARTIAL / LUMPECTOMY       No Known Allergies  Current Outpatient Medications on File Prior to Visit  Medication Sig Dispense Refill  . amLODIPine  (NORVASC ) 5 MG tablet Take 5 mg by mouth once daily    . hydroCHLOROthiazide  (HYDRODIURIL ) 25 MG tablet Take 25 mg by mouth once daily  Take 25 mg by mouth in the morning    . potassium chloride  (KLOR-CON ) 10 MEQ ER tablet Take 10 mEq by mouth once daily  Take 10 mEq by mouth in the morning.    . aspirin  81 MG chewable tablet Take 81 mg by mouth 2 (two) times daily    . chlorhexidine   (HIBICLENS ) 4 % external wash Apply 1 Application topically as directed  Apply 15 mLs (1 Application total) topically as directed for 30 doses. Use as directed daily for 5 days every other week for 6 weeks.     No current facility-administered medications on file prior to visit.    Family History  Problem Relation Age of Onset  . Breast cancer Mother   . Obesity Father   . High blood pressure (Hypertension) Father   . Diabetes Father   . Prostate cancer Brother   . Melanoma Brother   . Pancreatic cancer Maternal Uncle   . Ovarian cancer Maternal Grandmother   . Cancer Other      Social History   Tobacco Use  Smoking Status Never  Smokeless Tobacco Never     Social History   Socioeconomic History  . Marital status: Married  Tobacco Use  . Smoking status: Never  . Smokeless tobacco: Never  Vaping Use  . Vaping status: Never Used  Substance and Sexual Activity  . Drug use: Never   Social Drivers of Health   Food Insecurity: No Food Insecurity (10/22/2023)   Received from [facility]   Hunger Vital Sign   . Within the past 12 months, you worried that your food would run out before you got the money to buy more.:  Never true   . Within the past 12 months, the food you bought just didn't last and you didn't have money to get more.: Never true  Transportation Needs: No Transportation Needs (10/22/2023)   Received from [facility] - Transportation   . In the past 12 months, has lack of transportation kept you from medical appointments or from getting medications?: No   . In the past 12 months, has lack of transportation kept you from meetings, work, or from getting things needed for daily living?: No  Social Connections: Moderately Integrated (06/13/2023)   Received from [facility]   Social Connection and Isolation Panel   . In a typical week, how many times do you talk on the phone with family, friends, or neighbors?: More than three times a week   . How often do  you get together with friends or relatives?: More than three times a week   . How often do you attend church or religious services?: Never   . Do you belong to any clubs or organizations such as church groups, unions, fraternal or athletic groups, or school groups?: Yes   . How often do you attend meetings of the clubs or organizations you belong to?: More than 4 times per year   . Are you married, widowed, divorced, separated, never married, or living with a partner?: Married  Housing Stability: Unknown (12/05/2023)   Housing Stability Vital Sign   . Homeless in the Last Year: No    Objective:    There were no vitals filed for this visit.  There is no height or weight on file to calculate BMI.  Right breast incision clean dry intact.     Labs, Imaging and Diagnostic Testing:    SURGICAL PATHOLOGY CASE: [id number] PATIENT: Colleen Wiley Surgical Pathology Report     Clinical History: DCIS right breast (cm)     FINAL MICROSCOPIC DIAGNOSIS:  A. BREAST, RIGHT, LUMPECTOMY: Ductal carcinoma in situ, type solid, nuclear grade intermediate, without necrosis DCIS greatest dimension: Approximately 1 cm Margins: All margins negative for ductal carcinoma in situ     Closest margin: Anterior, 0.2 cm Prognostic markers: ER 100%, positive, strong staining intensity, PR 10%, positive, moderate to strong staining intensity Biopsy site changes present Other findings: None See oncology table  ONCOLOGY TABLE:   DCIS OF THE BREAST:  Resection Procedure: Lumpectomy Specimen Laterality: Right Histologic Type: Ductal carcinoma in situ Size of DCIS: Approximately 1 cm Nuclear Grade: Intermediate Necrosis: Not identified Margins: All margins negative for ductal carcinoma in situ      Specify Closest Margin (required only if <72mm): Anterior, 2 mm Regional Lymph Nodes: Not applicable      Breast Biomarker Testing Performed on Previous Biopsy:      Testing performed on Case  Number: [id number]      Estrogen Receptor: 100%, positive, strong staining intensity      Progesterone Receptor: 10%, positive, moderate to strong staining intensity Pathologic Stage Classification (pTNM, AJCC 8th Edition): pTis, pN not assigned Representative Tumor Block: A2 Comment(s): None (v4.4.0.0)    GROSS DESCRIPTION:  A. Specimen type: received fresh and subsequently placed in formalin labeled with the patient's name and Right breast lumpectomy is a piece of fibrofatty tissue grossly consistent with clinically stated lumpectomy. Time collected: 10:45, time in formalin: 12:10 on 11/11/23; CIT >1 hour. Size: 5.1 cm superior-inferior, 4.6 cm medial-lateral, and 2.1 cm anterior-posterior. Orientation: the specimen has been previously inked in the OR as follows:  anterior - green, inferior - blue, lateral - orange, medial - yellow, posterior - black, and superior - red. Localized area: the preoperatively implanted radioactive seed is identified, removed, and sequestered according to protocol. Cut surface: a 1.9 x 1.2 x 0.9 cm ill-defined, tan, indurated lesion surrounds an X-shaped biopsy clip. Margins: superior - 0.3 cm, inferior - 1.9 cm, medial - 0.5 cm, lateral - 1.9 cm, anterior - 0.1 cm, and posterior - 0.6 cm. The lesion is entirely and sequentially submitted from superior to inferior as follows: A1: superior end, perpendicular A2: lesion with anterior, posterior, and medial margins (X clip) A3-A5: lesion without margins A6: inferior end, perpendicular A7: closest lateral margin  (LEF 11/11/2023)  Final Diagnosis performed by Ilsa Pottier, MD.   Electronically signed 11/13/2023 Technical component performed at [facility]. [facility], [address], [place], [place] [address].  Professional component performed at [facility], [address], [place], [place] [address].  Immunohistochemistry Technical component (if applicable) was  performed at [facility]. [address], [place], [place] [address].   IMMUNOHISTOCHEMISTRY DISCLAIMER (if applicable): Some of these immunohistochemical stains may have been developed and the performance characteristics determine by [facility]. Some may not have been cleared or approved by the U.S. Food and Drug Administration. The FDA has determined that such clearance or approval is not necessary. This test is used for clinical purposes. It should not be regarded as investigational or for research. This laboratory is certified under the Clinical Laboratory Improvement Amendments of 1988 (CLIA-88) as qualified to perform high complexity clinical laboratory testing.  The controls stained appropriately.   IHC stains are performed on formalin fixed, paraffin embedded tissue using a 3,3diaminobenzidine (DAB) chromogen and Leica Bond Autostainer System. The staining intensity of the nucleus is score manually and is reported as the percentage of tumor cell nuclei demonstrating specific nuclear staining. The specimens are fixed in 10% Neutral Formalin for at least 6 hours and up to 72hrs. These tests are validated on decalcified tissue. Results should be interpreted with caution given the possibility of false negative results on decalcified specimens. Antibody Clones are as follows ER-clone 35F, PR-clone 16, Ki67- clone MM1. Some of these immunohistochemical stains may have been developed and the performance characteristics determined by [facility]    Assessment and Plan:     Diagnoses and all orders for this visit:  Post-operative state     Follow-up 6 months  Margins are clear  Resume full activity  No follow-ups on file.   DEBBY CURTISTINE SHIPPER, MD

## 2023-12-05 NOTE — Progress Notes (Signed)
 Location of Breast Cancer:right   Histology per Pathology Report:    Receptor Status: ER(100), PR (10), Her2-neu (), Ki-67)  Did patient present with symptoms (if so, please note symptoms) or was this found on screening mammography?: ***  Past/Anticipated interventions by surgeon, if any:   Past/Anticipated interventions by medical oncology, if any:   Lymphedema issues, if any:  {:18581} {t:21944}   Pain issues, if any:  {:18581} {PAIN DESCRIPTION:21022940}  SAFETY ISSUES: Prior radiation? {:18581} Pacemaker/ICD? {:18581} Possible current pregnancy?{:18581} Is the patient on methotrexate? {:18581}  Current Complaints / other details:  ***

## 2023-12-06 NOTE — Progress Notes (Signed)
 Radiation Oncology         [phone] ________________________________  Name: Colleen Wiley MRN: [id number]  Date: 12/08/2023  DOB: August 20, 1950  Re-Evaluation Note  CC: Colleen Anthony RAMAN, FNP  Colleen Ash, MD  No diagnosis found.  Diagnosis:   Stage 0 (cTis (DCIS), cN0, cM0) Right Breast UOQ, Intermediate grade DCIS, ER+ / PR+ / Her2 not assessed: s/p right breast lumpectomy    Cancer Staging  Ductal carcinoma in situ (DCIS) of right breast Staging form: Breast, AJCC 8th Edition - Clinical stage from 10/22/2023: Stage 0 (cTis (DCIS), cN0, cM0, ER+, PR+, HER2: Not Assessed) - Unsigned  Narrative:  The patient returns today to discuss radiation treatment options. She was seen in the multidisciplinary breast for her initial consultation on 10/22/23.   She underwent genetic testing on her consultation date. 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 This mutation is consistent with an ATM-related predisposition to cancer, which puts her at a 21-24% increased lifetime risk of breast cancer, and at a 5-10% increased risk of pancreatic cancer.   Since her consultation date, she opted to proceed with a right breast lumpectomy without nodal biopsies on 11/11/23 under the care of Dr. Vanderbilt. Pathology from the procedure revealed: intermediate grade DCIS measuring approximately 1 cm in the greatest extent; all margins negative for in situ carcinoma; margin status to in situ disease of 0.2 cm from the anterior margin. Prognostic indicators (from her initial biopsy specimen) significant for: estrogen receptor 100% positive with strong staining intensity, and progesterone receptor 10% positive with moderate to strong staining intensity; Her2 not assessed.   She was recently seen by Dr. Loretha on 11/25/23 to discuss adjuvant treatment options. After a detailed discussion with Dr. Loretha  outlining the risks of recurrence respective to the combo of both adjuvant XRT and antiestrogen therapy vs each therapy alone, she has declined antiestrogen therapy and has opted proceed with XRT alone which we will discuss in detail together today.    In the setting of her ATM mutation, Dr. Loretha would like her to present for annual surveillance imaging with [facility].   On review of systems, the patient reports ***. She denies *** and any other symptoms.    Allergies:  has no known allergies.  Meds: Current Outpatient Medications  Medication Sig Dispense Refill   amLODipine  (NORVASC ) 5 MG tablet Take 5 mg by mouth in the morning.     Cholecalciferol (VITAMIN D3 PO) Take 1 tablet by mouth 4 (four) times a week. Chewable     hydrochlorothiazide  (HYDRODIURIL ) 25 MG tablet Take 25 mg by mouth in the morning.     ibuprofen  (ADVIL ) 800 MG tablet Take 1 tablet (800 mg total) by mouth every 8 (eight) hours as needed. 30 tablet 0   oxyCODONE  (OXY IR/ROXICODONE ) 5 MG immediate release tablet Take 1 tablet (5 mg total) by mouth every 6 (six) hours as needed for severe pain (pain score 7-10). 15 tablet 0   potassium chloride  (KLOR-CON ) 10 MEQ tablet Take 10 mEq by mouth in the morning.     No current facility-administered medications for this encounter.    Physical Findings: The patient is in no acute distress. Patient is alert and oriented.  vitals were not taken for this visit.  No significant changes. Lungs are clear to auscultation bilaterally. Heart has regular rate and rhythm. No palpable cervical, supraclavicular, or axillary adenopathy. Abdomen soft, non-tender,  normal bowel sounds. Left Breast: no palpable mass, nipple discharge or bleeding. Right Breast: ***  Lab Findings: Lab Results  Component Value Date   WBC 11.0 (H) 10/22/2023   HGB 15.9 (H) 10/22/2023   HCT 45.6 10/22/2023   MCV 84.8 10/22/2023   PLT 377 10/22/2023    Radiographic Findings: MM Breast Surgical Specimen Result  Date: 11/11/2023 CLINICAL DATA:  Biopsy-proven DCIS involving the RIGHT breast. Radioactive seed localization was performed on 11/07/2023 in anticipation of today's lumpectomy. EXAM: SPECIMEN RADIOGRAPH OF THE RIGHT BREAST COMPARISON:  Previous exam(s). FINDINGS: Status post excision of the RIGHT breast. Images are submitted for interpretation post-operatively. The X shaped tissue marking clip and the radioactive seed are present in the non-compressed specimen. The seed is intact. IMPRESSION: Specimen radiograph of the RIGHT breast. Electronically Signed   By: Debby Satterfield M.D.   On: 11/11/2023 16:09   MM RT RADIOACTIVE SEED LOC MAMMO GUIDE Result Date: 11/07/2023 CLINICAL DATA:  73 year old woman with history of RIGHT breast DCIS presents for stereotactic guided radioactive seed placement prior to excision. EXAM: MAMMOGRAPHIC GUIDED RADIOACTIVE SEED LOCALIZATION OF THE RIGHT BREAST COMPARISON:  Previous exam(s). FINDINGS: Patient presents for radioactive seed localization prior to RIGHT breast lumpectomy. I met with the patient and we discussed the procedure of seed localization including benefits and alternatives. We discussed the high likelihood of a successful procedure. We discussed the risks of the procedure including infection, bleeding, tissue injury and further surgery. We discussed the low dose of radioactivity involved in the procedure. Informed, written consent was given. The usual time-out protocol was performed immediately prior to the procedure. Using mammographic guidance, sterile technique, 1% lidocaine  and an I-125 radioactive seed, X shaped biopsy marking clip was localized using a superior approach. The follow-up mammogram images confirm the seed in the expected location and were marked for Dr. Vanderbilt. Follow-up survey of the patient confirms presence of the radioactive seed. Order number of I-125 seed:  [id number]. Total activity:  0.253 mCi reference Date: 10/27/2023 The patient  tolerated the procedure well and was released from the [facility]. She was given instructions regarding seed removal. IMPRESSION: Radioactive seed localization RIGHT breast. No apparent complications. Electronically Signed   By: Aliene Lloyd M.D.   On: 11/07/2023 09:51    Impression:  Stage 0 (cTis (DCIS), cN0, cM0) Right Breast UOQ, Intermediate grade DCIS, ER+ / PR+ / Her2 not assessed: s/p right breast lumpectomy   ***  Plan:  Patient is scheduled for CT simulation {date/later today}. ***  -----------------------------------  Lynwood CHARM Nasuti, PhD, MD  This document serves as a record of services personally performed by Lynwood Nasuti, MD. It was created on his behalf by Dorthy Fuse, a trained medical scribe. The creation of this record is based on the scribe's personal observations and the provider's statements to them. This document has been checked and approved by the attending provider.

## 2023-12-08 ENCOUNTER — Ambulatory Visit
Admission: RE | Admit: 2023-12-08 | Discharge: 2023-12-08 | Disposition: A | Discharge status: Home / Self Care | Source: Ambulatory Visit | Attending: Radiation Oncology | Admitting: Radiation Oncology

## 2023-12-08 ENCOUNTER — Encounter: Payer: Self-pay | Admitting: Radiation Oncology

## 2023-12-08 VITALS — BP 134/94 | HR 80 | Temp 97.3°F | Resp 18 | Ht 63.5 in | Wt 192.4 lb

## 2023-12-08 DIAGNOSIS — Z170 Estrogen receptor positive status [ER+]: Secondary | ICD-10-CM | POA: Diagnosis not present

## 2023-12-08 DIAGNOSIS — Z1721 Progesterone receptor positive status: Secondary | ICD-10-CM | POA: Insufficient documentation

## 2023-12-08 DIAGNOSIS — D0511 Intraductal carcinoma in situ of right breast: Secondary | ICD-10-CM

## 2023-12-09 ENCOUNTER — Ambulatory Visit
Admission: RE | Admit: 2023-12-09 | Discharge: 2023-12-09 | Disposition: A | Discharge status: Home / Self Care | Source: Ambulatory Visit | Attending: Radiation Oncology | Admitting: Radiation Oncology

## 2023-12-09 DIAGNOSIS — D0511 Intraductal carcinoma in situ of right breast: Secondary | ICD-10-CM | POA: Insufficient documentation

## 2023-12-16 ENCOUNTER — Encounter: Payer: Self-pay | Admitting: *Deleted

## 2023-12-16 DIAGNOSIS — D0511 Intraductal carcinoma in situ of right breast: Secondary | ICD-10-CM | POA: Insufficient documentation

## 2023-12-18 ENCOUNTER — Other Ambulatory Visit: Payer: Self-pay

## 2023-12-18 ENCOUNTER — Ambulatory Visit
Admission: RE | Admit: 2023-12-18 | Discharge: 2023-12-18 | Disposition: A | Source: Ambulatory Visit | Attending: Radiation Oncology | Admitting: Radiation Oncology

## 2023-12-18 DIAGNOSIS — D0511 Intraductal carcinoma in situ of right breast: Secondary | ICD-10-CM | POA: Diagnosis not present

## 2023-12-18 LAB — RAD ONC ARIA SESSION SUMMARY
Course Elapsed Days: 0
Plan Fractions Treated to Date: 1
Plan Prescribed Dose Per Fraction: 2.67 Gy
Plan Total Fractions Prescribed: 15
Plan Total Prescribed Dose: 40.05 Gy
Reference Point Dosage Given to Date: 2.67 Gy
Reference Point Session Dosage Given: 2.67 Gy
Session Number: 1

## 2023-12-19 ENCOUNTER — Other Ambulatory Visit: Payer: Self-pay

## 2023-12-19 ENCOUNTER — Ambulatory Visit
Admission: RE | Admit: 2023-12-19 | Discharge: 2023-12-19 | Disposition: A | Source: Ambulatory Visit | Attending: Radiation Oncology | Admitting: Radiation Oncology

## 2023-12-19 DIAGNOSIS — D0511 Intraductal carcinoma in situ of right breast: Secondary | ICD-10-CM

## 2023-12-19 LAB — RAD ONC ARIA SESSION SUMMARY
Course Elapsed Days: 1
Plan Fractions Treated to Date: 2
Plan Prescribed Dose Per Fraction: 2.67 Gy
Plan Total Fractions Prescribed: 15
Plan Total Prescribed Dose: 40.05 Gy
Reference Point Dosage Given to Date: 5.34 Gy
Reference Point Session Dosage Given: 2.67 Gy
Session Number: 2

## 2023-12-19 MED ORDER — RADIAPLEXRX EX GEL: Freq: Once | CUTANEOUS | Status: AC

## 2023-12-19 MED ORDER — ALRA NON-METALLIC DEODORANT (RAD-ONC)
1.0000 | Freq: Once | TOPICAL | Status: AC
  Administered 2023-12-19: 1 via TOPICAL

## 2023-12-22 ENCOUNTER — Other Ambulatory Visit: Payer: Self-pay

## 2023-12-22 ENCOUNTER — Ambulatory Visit
Admission: RE | Admit: 2023-12-22 | Discharge: 2023-12-22 | Disposition: A | Source: Ambulatory Visit | Attending: Radiation Oncology | Admitting: Radiation Oncology

## 2023-12-22 DIAGNOSIS — D0511 Intraductal carcinoma in situ of right breast: Secondary | ICD-10-CM | POA: Diagnosis not present

## 2023-12-22 LAB — RAD ONC ARIA SESSION SUMMARY
Course Elapsed Days: 4
Plan Fractions Treated to Date: 3
Plan Prescribed Dose Per Fraction: 2.67 Gy
Plan Total Fractions Prescribed: 15
Plan Total Prescribed Dose: 40.05 Gy
Reference Point Dosage Given to Date: 8.01 Gy
Reference Point Session Dosage Given: 2.67 Gy
Session Number: 3

## 2023-12-23 ENCOUNTER — Ambulatory Visit
Admission: RE | Admit: 2023-12-23 | Discharge: 2023-12-23 | Disposition: A | Source: Ambulatory Visit | Attending: Radiation Oncology | Admitting: Radiation Oncology

## 2023-12-23 ENCOUNTER — Other Ambulatory Visit: Payer: Self-pay

## 2023-12-23 ENCOUNTER — Ambulatory Visit
Admission: RE | Admit: 2023-12-23 | Discharge: 2023-12-23 | Attending: Radiation Oncology | Admitting: Radiation Oncology

## 2023-12-23 DIAGNOSIS — D0511 Intraductal carcinoma in situ of right breast: Secondary | ICD-10-CM | POA: Diagnosis not present

## 2023-12-23 LAB — RAD ONC ARIA SESSION SUMMARY
Course Elapsed Days: 5
Plan Fractions Treated to Date: 4
Plan Prescribed Dose Per Fraction: 2.67 Gy
Plan Total Fractions Prescribed: 15
Plan Total Prescribed Dose: 40.05 Gy
Reference Point Dosage Given to Date: 10.68 Gy
Reference Point Session Dosage Given: 2.67 Gy
Session Number: 4

## 2023-12-24 ENCOUNTER — Ambulatory Visit
Admission: RE | Admit: 2023-12-24 | Discharge: 2023-12-24 | Attending: Radiation Oncology | Admitting: Radiation Oncology

## 2023-12-24 ENCOUNTER — Other Ambulatory Visit: Payer: Self-pay

## 2023-12-24 DIAGNOSIS — D0511 Intraductal carcinoma in situ of right breast: Secondary | ICD-10-CM | POA: Diagnosis not present

## 2023-12-24 LAB — RAD ONC ARIA SESSION SUMMARY
Course Elapsed Days: 6
Plan Fractions Treated to Date: 5
Plan Prescribed Dose Per Fraction: 2.67 Gy
Plan Total Fractions Prescribed: 15
Plan Total Prescribed Dose: 40.05 Gy
Reference Point Dosage Given to Date: 13.35 Gy
Reference Point Session Dosage Given: 2.67 Gy
Session Number: 5

## 2023-12-25 ENCOUNTER — Other Ambulatory Visit: Payer: Self-pay

## 2023-12-25 ENCOUNTER — Ambulatory Visit
Admission: RE | Admit: 2023-12-25 | Discharge: 2023-12-25 | Disposition: A | Discharge status: Home / Self Care | Source: Ambulatory Visit | Attending: Radiation Oncology | Admitting: Radiation Oncology

## 2023-12-25 DIAGNOSIS — D0511 Intraductal carcinoma in situ of right breast: Secondary | ICD-10-CM | POA: Diagnosis not present

## 2023-12-25 LAB — RAD ONC ARIA SESSION SUMMARY
Course Elapsed Days: 7
Plan Fractions Treated to Date: 6
Plan Prescribed Dose Per Fraction: 2.67 Gy
Plan Total Fractions Prescribed: 15
Plan Total Prescribed Dose: 40.05 Gy
Reference Point Dosage Given to Date: 16.02 Gy
Reference Point Session Dosage Given: 2.67 Gy
Session Number: 6

## 2023-12-26 ENCOUNTER — Ambulatory Visit
Admission: RE | Admit: 2023-12-26 | Discharge: 2023-12-26 | Disposition: A | Source: Ambulatory Visit | Attending: Radiation Oncology | Admitting: Radiation Oncology

## 2023-12-26 ENCOUNTER — Other Ambulatory Visit: Payer: Self-pay

## 2023-12-26 DIAGNOSIS — D0511 Intraductal carcinoma in situ of right breast: Secondary | ICD-10-CM | POA: Diagnosis not present

## 2023-12-26 LAB — RAD ONC ARIA SESSION SUMMARY
Course Elapsed Days: 8
Plan Fractions Treated to Date: 7
Plan Prescribed Dose Per Fraction: 2.67 Gy
Plan Total Fractions Prescribed: 15
Plan Total Prescribed Dose: 40.05 Gy
Reference Point Dosage Given to Date: 18.69 Gy
Reference Point Session Dosage Given: 2.67 Gy
Session Number: 7

## 2023-12-29 ENCOUNTER — Ambulatory Visit
Admission: RE | Admit: 2023-12-29 | Discharge: 2023-12-29 | Disposition: A | Source: Ambulatory Visit | Attending: Radiation Oncology | Admitting: Radiation Oncology

## 2023-12-29 ENCOUNTER — Other Ambulatory Visit: Payer: Self-pay

## 2023-12-29 DIAGNOSIS — D0511 Intraductal carcinoma in situ of right breast: Secondary | ICD-10-CM | POA: Diagnosis not present

## 2023-12-29 LAB — RAD ONC ARIA SESSION SUMMARY
Course Elapsed Days: 11
Plan Fractions Treated to Date: 8
Plan Prescribed Dose Per Fraction: 2.67 Gy
Plan Total Fractions Prescribed: 15
Plan Total Prescribed Dose: 40.05 Gy
Reference Point Dosage Given to Date: 21.36 Gy
Reference Point Session Dosage Given: 2.67 Gy
Session Number: 8

## 2023-12-30 ENCOUNTER — Other Ambulatory Visit: Payer: Self-pay

## 2023-12-30 ENCOUNTER — Ambulatory Visit: Admission: RE | Admit: 2023-12-30

## 2023-12-30 ENCOUNTER — Ambulatory Visit
Admission: RE | Admit: 2023-12-30 | Discharge: 2023-12-30 | Disposition: A | Source: Ambulatory Visit | Attending: Radiation Oncology | Admitting: Radiation Oncology

## 2023-12-30 DIAGNOSIS — D0511 Intraductal carcinoma in situ of right breast: Secondary | ICD-10-CM | POA: Diagnosis not present

## 2023-12-30 LAB — RAD ONC ARIA SESSION SUMMARY
Course Elapsed Days: 12
Plan Fractions Treated to Date: 9
Plan Prescribed Dose Per Fraction: 2.67 Gy
Plan Total Fractions Prescribed: 15
Plan Total Prescribed Dose: 40.05 Gy
Reference Point Dosage Given to Date: 24.03 Gy
Reference Point Session Dosage Given: 2.67 Gy
Session Number: 9

## 2023-12-30 MED ORDER — RADIAPLEXRX EX GEL: Freq: Once | CUTANEOUS | Status: AC

## 2023-12-31 ENCOUNTER — Ambulatory Visit
Admission: RE | Admit: 2023-12-31 | Discharge: 2023-12-31 | Attending: Radiation Oncology | Admitting: Radiation Oncology

## 2023-12-31 ENCOUNTER — Other Ambulatory Visit: Payer: Self-pay

## 2023-12-31 DIAGNOSIS — D0511 Intraductal carcinoma in situ of right breast: Secondary | ICD-10-CM | POA: Diagnosis not present

## 2023-12-31 LAB — RAD ONC ARIA SESSION SUMMARY
Course Elapsed Days: 13
Plan Fractions Treated to Date: 10
Plan Prescribed Dose Per Fraction: 2.67 Gy
Plan Total Fractions Prescribed: 15
Plan Total Prescribed Dose: 40.05 Gy
Reference Point Dosage Given to Date: 26.7 Gy
Reference Point Session Dosage Given: 2.67 Gy
Session Number: 10

## 2024-01-01 ENCOUNTER — Ambulatory Visit
Admission: RE | Admit: 2024-01-01 | Discharge: 2024-01-01 | Attending: Radiation Oncology | Admitting: Radiation Oncology

## 2024-01-01 ENCOUNTER — Other Ambulatory Visit: Payer: Self-pay

## 2024-01-01 DIAGNOSIS — D0511 Intraductal carcinoma in situ of right breast: Secondary | ICD-10-CM | POA: Diagnosis not present

## 2024-01-01 LAB — RAD ONC ARIA SESSION SUMMARY
Course Elapsed Days: 14
Plan Fractions Treated to Date: 11
Plan Prescribed Dose Per Fraction: 2.67 Gy
Plan Total Fractions Prescribed: 15
Plan Total Prescribed Dose: 40.05 Gy
Reference Point Dosage Given to Date: 29.37 Gy
Reference Point Session Dosage Given: 2.67 Gy
Session Number: 11

## 2024-01-02 ENCOUNTER — Ambulatory Visit

## 2024-01-02 ENCOUNTER — Other Ambulatory Visit: Payer: Self-pay

## 2024-01-02 DIAGNOSIS — D0511 Intraductal carcinoma in situ of right breast: Secondary | ICD-10-CM | POA: Diagnosis not present

## 2024-01-02 LAB — RAD ONC ARIA SESSION SUMMARY
Course Elapsed Days: 15
Plan Fractions Treated to Date: 12
Plan Prescribed Dose Per Fraction: 2.67 Gy
Plan Total Fractions Prescribed: 15
Plan Total Prescribed Dose: 40.05 Gy
Reference Point Dosage Given to Date: 32.04 Gy
Reference Point Session Dosage Given: 2.67 Gy
Session Number: 12

## 2024-01-04 ENCOUNTER — Ambulatory Visit

## 2024-01-05 ENCOUNTER — Ambulatory Visit
Admission: RE | Admit: 2024-01-05 | Discharge: 2024-01-05 | Attending: Radiation Oncology | Admitting: Radiation Oncology

## 2024-01-05 ENCOUNTER — Other Ambulatory Visit: Payer: Self-pay

## 2024-01-05 DIAGNOSIS — D0511 Intraductal carcinoma in situ of right breast: Secondary | ICD-10-CM | POA: Diagnosis not present

## 2024-01-05 LAB — RAD ONC ARIA SESSION SUMMARY
Course Elapsed Days: 18
Plan Fractions Treated to Date: 13
Plan Prescribed Dose Per Fraction: 2.67 Gy
Plan Total Fractions Prescribed: 15
Plan Total Prescribed Dose: 40.05 Gy
Reference Point Dosage Given to Date: 34.71 Gy
Reference Point Session Dosage Given: 2.67 Gy
Session Number: 13

## 2024-01-06 ENCOUNTER — Ambulatory Visit
Admission: RE | Admit: 2024-01-06 | Discharge: 2024-01-06 | Disposition: A | Discharge status: Home / Self Care | Source: Ambulatory Visit | Attending: Radiation Oncology | Admitting: Radiation Oncology

## 2024-01-06 ENCOUNTER — Ambulatory Visit: Admitting: Radiation Oncology

## 2024-01-06 ENCOUNTER — Other Ambulatory Visit: Payer: Self-pay

## 2024-01-06 DIAGNOSIS — D0511 Intraductal carcinoma in situ of right breast: Secondary | ICD-10-CM | POA: Diagnosis not present

## 2024-01-06 LAB — RAD ONC ARIA SESSION SUMMARY
Course Elapsed Days: 19
Plan Fractions Treated to Date: 14
Plan Prescribed Dose Per Fraction: 2.67 Gy
Plan Total Fractions Prescribed: 15
Plan Total Prescribed Dose: 40.05 Gy
Reference Point Dosage Given to Date: 37.38 Gy
Reference Point Session Dosage Given: 2.67 Gy
Session Number: 14

## 2024-01-07 ENCOUNTER — Other Ambulatory Visit: Payer: Self-pay

## 2024-01-07 ENCOUNTER — Ambulatory Visit
Admission: RE | Admit: 2024-01-07 | Discharge: 2024-01-07 | Disposition: A | Source: Ambulatory Visit | Attending: Radiation Oncology | Admitting: Radiation Oncology

## 2024-01-07 DIAGNOSIS — D0511 Intraductal carcinoma in situ of right breast: Secondary | ICD-10-CM | POA: Diagnosis not present

## 2024-01-07 LAB — RAD ONC ARIA SESSION SUMMARY
Course Elapsed Days: 20
Plan Fractions Treated to Date: 15
Plan Prescribed Dose Per Fraction: 2.67 Gy
Plan Total Fractions Prescribed: 15
Plan Total Prescribed Dose: 40.05 Gy
Reference Point Dosage Given to Date: 40.05 Gy
Reference Point Session Dosage Given: 2.67 Gy
Session Number: 15

## 2024-01-12 ENCOUNTER — Ambulatory Visit
Admission: RE | Admit: 2024-01-12 | Discharge: 2024-01-12 | Disposition: A | Discharge status: Home / Self Care | Source: Ambulatory Visit | Attending: Radiation Oncology | Admitting: Radiation Oncology

## 2024-01-12 ENCOUNTER — Other Ambulatory Visit: Payer: Self-pay

## 2024-01-12 DIAGNOSIS — D0511 Intraductal carcinoma in situ of right breast: Secondary | ICD-10-CM | POA: Diagnosis not present

## 2024-01-12 LAB — RAD ONC ARIA SESSION SUMMARY
Course Elapsed Days: 25
Plan Fractions Treated to Date: 1
Plan Prescribed Dose Per Fraction: 2 Gy
Plan Total Fractions Prescribed: 6
Plan Total Prescribed Dose: 12 Gy
Reference Point Dosage Given to Date: 2 Gy
Reference Point Session Dosage Given: 2 Gy
Session Number: 16

## 2024-01-13 ENCOUNTER — Ambulatory Visit
Admission: RE | Admit: 2024-01-13 | Discharge: 2024-01-13 | Disposition: A | Discharge status: Home / Self Care | Source: Ambulatory Visit | Attending: Radiation Oncology | Admitting: Radiation Oncology

## 2024-01-13 ENCOUNTER — Other Ambulatory Visit: Payer: Self-pay

## 2024-01-13 DIAGNOSIS — D0511 Intraductal carcinoma in situ of right breast: Secondary | ICD-10-CM | POA: Diagnosis not present

## 2024-01-13 LAB — RAD ONC ARIA SESSION SUMMARY
Course Elapsed Days: 26
Plan Fractions Treated to Date: 2
Plan Prescribed Dose Per Fraction: 2 Gy
Plan Total Fractions Prescribed: 6
Plan Total Prescribed Dose: 12 Gy
Reference Point Dosage Given to Date: 4 Gy
Reference Point Session Dosage Given: 2 Gy
Session Number: 17

## 2024-01-14 ENCOUNTER — Other Ambulatory Visit: Payer: Self-pay

## 2024-01-14 ENCOUNTER — Ambulatory Visit
Admission: RE | Admit: 2024-01-14 | Discharge: 2024-01-14 | Disposition: A | Discharge status: Home / Self Care | Source: Ambulatory Visit | Attending: Radiation Oncology | Admitting: Radiation Oncology

## 2024-01-14 DIAGNOSIS — D0511 Intraductal carcinoma in situ of right breast: Secondary | ICD-10-CM | POA: Diagnosis not present

## 2024-01-14 LAB — RAD ONC ARIA SESSION SUMMARY
Course Elapsed Days: 27
Plan Fractions Treated to Date: 3
Plan Prescribed Dose Per Fraction: 2 Gy
Plan Total Fractions Prescribed: 6
Plan Total Prescribed Dose: 12 Gy
Reference Point Dosage Given to Date: 6 Gy
Reference Point Session Dosage Given: 2 Gy
Session Number: 18

## 2024-01-16 ENCOUNTER — Other Ambulatory Visit: Payer: Self-pay

## 2024-01-16 ENCOUNTER — Ambulatory Visit
Admission: RE | Admit: 2024-01-16 | Discharge: 2024-01-16 | Disposition: A | Discharge status: Home / Self Care | Source: Ambulatory Visit | Attending: Radiation Oncology | Admitting: Radiation Oncology

## 2024-01-16 DIAGNOSIS — D0511 Intraductal carcinoma in situ of right breast: Secondary | ICD-10-CM | POA: Insufficient documentation

## 2024-01-16 LAB — RAD ONC ARIA SESSION SUMMARY
Course Elapsed Days: 29
Plan Fractions Treated to Date: 4
Plan Prescribed Dose Per Fraction: 2 Gy
Plan Total Fractions Prescribed: 6
Plan Total Prescribed Dose: 12 Gy
Reference Point Dosage Given to Date: 8 Gy
Reference Point Session Dosage Given: 2 Gy
Session Number: 19

## 2024-01-19 ENCOUNTER — Ambulatory Visit
Admission: RE | Admit: 2024-01-19 | Discharge: 2024-01-19 | Disposition: A | Discharge status: Home / Self Care | Source: Ambulatory Visit | Attending: Radiation Oncology | Admitting: Radiation Oncology

## 2024-01-19 ENCOUNTER — Ambulatory Visit

## 2024-01-19 ENCOUNTER — Other Ambulatory Visit: Payer: Self-pay

## 2024-01-19 DIAGNOSIS — D0511 Intraductal carcinoma in situ of right breast: Secondary | ICD-10-CM | POA: Diagnosis not present

## 2024-01-19 LAB — RAD ONC ARIA SESSION SUMMARY
Course Elapsed Days: 32
Plan Fractions Treated to Date: 5
Plan Prescribed Dose Per Fraction: 2 Gy
Plan Total Fractions Prescribed: 6
Plan Total Prescribed Dose: 12 Gy
Reference Point Dosage Given to Date: 10 Gy
Reference Point Session Dosage Given: 2 Gy
Session Number: 20

## 2024-01-20 ENCOUNTER — Ambulatory Visit
Admission: RE | Admit: 2024-01-20 | Discharge: 2024-01-20 | Attending: Radiation Oncology | Admitting: Radiation Oncology

## 2024-01-20 ENCOUNTER — Other Ambulatory Visit: Payer: Self-pay

## 2024-01-20 ENCOUNTER — Ambulatory Visit
Admission: RE | Admit: 2024-01-20 | Discharge: 2024-01-20 | Disposition: A | Discharge status: Home / Self Care | Source: Ambulatory Visit | Attending: Radiation Oncology | Admitting: Radiation Oncology

## 2024-01-20 ENCOUNTER — Ambulatory Visit

## 2024-01-20 DIAGNOSIS — D0511 Intraductal carcinoma in situ of right breast: Secondary | ICD-10-CM | POA: Diagnosis not present

## 2024-01-20 LAB — RAD ONC ARIA SESSION SUMMARY
Course Elapsed Days: 33
Plan Fractions Treated to Date: 6
Plan Prescribed Dose Per Fraction: 2 Gy
Plan Total Fractions Prescribed: 6
Plan Total Prescribed Dose: 12 Gy
Reference Point Dosage Given to Date: 12 Gy
Reference Point Session Dosage Given: 2 Gy
Session Number: 21

## 2024-01-20 MED ORDER — RADIAPLEXRX EX GEL: Freq: Once | CUTANEOUS | Status: AC

## 2024-01-21 NOTE — Radiation Completion Notes (Addendum)
" °  Radiation Oncology         [phone] ________________________________  Name: Colleen Wiley MRN: [id number]  Date of Service: 01/20/2024  DOB: 1950/07/11  End of Treatment Note   Diagnosis: Stage 0 (cTis (DCIS), cN0, cM0) Right Breast UOQ, Intermediate grade DCIS, ER+ / PR+  Intent: Curative     ==========DELIVERED PLANS==========  First Treatment Date: 2023-12-18 Last Treatment Date: 2024-01-20   Plan Name: Breast_R Site: Breast, Right Technique: 3D Mode: Photon Dose Per Fraction: 2.67 Gy Prescribed Dose (Delivered / Prescribed): 40.05 Gy / 40.05 Gy Prescribed Fxs (Delivered / Prescribed): 15 / 15   Plan Name: Breast_R_Bst Site: Breast, Right Technique: 3D Mode: Photon Dose Per Fraction: 2 Gy Prescribed Dose (Delivered / Prescribed): 12 Gy / 12 Gy Prescribed Fxs (Delivered / Prescribed): 6 / 6     ====================================   The patient tolerated radiation well overall. She developed anticipated skin changes in the treatment field.   The patient will return in one month and will continue follow up with Dr. Loretha as well.      Ronita Due, PA-C "

## 2024-01-25 ENCOUNTER — Other Ambulatory Visit (HOSPITAL_BASED_OUTPATIENT_CLINIC_OR_DEPARTMENT_OTHER): Payer: Self-pay | Admitting: Family Medicine

## 2024-01-25 DIAGNOSIS — E2839 Other primary ovarian failure: Secondary | ICD-10-CM

## 2024-02-20 ENCOUNTER — Encounter: Payer: Self-pay | Admitting: Radiation Oncology

## 2024-02-23 ENCOUNTER — Ambulatory Visit: Admitting: Radiation Oncology

## 2024-02-24 ENCOUNTER — Ambulatory Visit: Admitting: Radiology

## 2024-05-03 ENCOUNTER — Ambulatory Visit: Admitting: Orthopaedic Surgery

## 2024-05-25 ENCOUNTER — Inpatient Hospital Stay: Attending: Hematology and Oncology | Admitting: Hematology and Oncology

## 2024-08-17 ENCOUNTER — Inpatient Hospital Stay: Admitting: Adult Health
